# Patient Record
Sex: Male | Born: 1960 | Race: White | Hispanic: No | Marital: Married | Smoking: Never smoker
Health system: Southern US, Community
[De-identification: ages and names within clinical notes are randomized; demographics above are authoritative.]

## PROBLEM LIST (undated history)

## (undated) DIAGNOSIS — I10 Essential (primary) hypertension: Secondary | ICD-10-CM

## (undated) DIAGNOSIS — K219 Gastro-esophageal reflux disease without esophagitis: Secondary | ICD-10-CM

## (undated) DIAGNOSIS — E785 Hyperlipidemia, unspecified: Secondary | ICD-10-CM

## (undated) DIAGNOSIS — K760 Fatty (change of) liver, not elsewhere classified: Secondary | ICD-10-CM

## (undated) DIAGNOSIS — Z8719 Personal history of other diseases of the digestive system: Secondary | ICD-10-CM

## (undated) DIAGNOSIS — Z9889 Other specified postprocedural states: Secondary | ICD-10-CM

## (undated) DIAGNOSIS — C61 Malignant neoplasm of prostate: Secondary | ICD-10-CM

## (undated) DIAGNOSIS — J069 Acute upper respiratory infection, unspecified: Secondary | ICD-10-CM

## (undated) DIAGNOSIS — I739 Peripheral vascular disease, unspecified: Secondary | ICD-10-CM

## (undated) DIAGNOSIS — R112 Nausea with vomiting, unspecified: Secondary | ICD-10-CM

## (undated) DIAGNOSIS — Z86718 Personal history of other venous thrombosis and embolism: Secondary | ICD-10-CM

## (undated) DIAGNOSIS — G5631 Lesion of radial nerve, right upper limb: Secondary | ICD-10-CM

## (undated) DIAGNOSIS — Z86711 Personal history of pulmonary embolism: Secondary | ICD-10-CM

## (undated) DIAGNOSIS — N329 Bladder disorder, unspecified: Secondary | ICD-10-CM

## (undated) HISTORY — PX: COLONOSCOPY: SHX174

## (undated) HISTORY — DX: Hyperlipidemia, unspecified: E78.5

## (undated) HISTORY — PX: TONSILLECTOMY: SUR1361

## (undated) HISTORY — PX: PROSTATE BIOPSY: SHX241

## (undated) HISTORY — DX: Personal history of pulmonary embolism: Z86.711

## (undated) HISTORY — PX: FACIAL NERVE DECOMPRESSION: SHX185

## (undated) HISTORY — DX: Personal history of other venous thrombosis and embolism: Z86.718

## (undated) HISTORY — PX: UPPER GI ENDOSCOPY: SHX6162

## (undated) HISTORY — DX: Malignant neoplasm of prostate: C61

## (undated) HISTORY — PX: OTHER SURGICAL HISTORY: SHX169

---

## 2001-08-23 ENCOUNTER — Inpatient Hospital Stay (HOSPITAL_COMMUNITY): Admission: EM | Admit: 2001-08-23 | Discharge: 2001-08-29 | Payer: Self-pay | Admitting: Emergency Medicine

## 2004-03-29 HISTORY — PX: NM MYOCAR PERF WALL MOTION: HXRAD629

## 2005-03-16 HISTORY — PX: CARDIAC CATHETERIZATION: SHX172

## 2007-03-30 HISTORY — PX: TRANSTHORACIC ECHOCARDIOGRAM: SHX275

## 2009-09-30 ENCOUNTER — Ambulatory Visit (HOSPITAL_COMMUNITY): Admission: RE | Admit: 2009-09-30 | Discharge: 2009-09-30 | Payer: Self-pay | Admitting: Orthopedic Surgery

## 2010-06-14 LAB — BASIC METABOLIC PANEL
BUN: 10 mg/dL (ref 6–23)
CO2: 30 mEq/L (ref 19–32)
Calcium: 9.5 mg/dL (ref 8.4–10.5)
Chloride: 104 mEq/L (ref 96–112)
Creatinine, Ser: 0.87 mg/dL (ref 0.4–1.5)
GFR calc Af Amer: >60 mL/min (ref >60)
GFR calc non Af Amer: >60 mL/min (ref >60)
Glucose, Bld: 93 mg/dL (ref 70–99)
Potassium: 5.4 mEq/L — ABNORMAL HIGH (ref 3.5–5.1)
Sodium: 139 mEq/L (ref 135–145)

## 2010-06-14 LAB — CBC
HCT: 47.3 % (ref 39.0–52.0)
Hemoglobin: 16.3 g/dL (ref 13.0–17.0)
MCH: 34.2 pg — ABNORMAL HIGH (ref 26.0–34.0)
MCHC: 34.4 g/dL (ref 30.0–36.0)
MCV: 99.3 fL (ref 78.0–100.0)
Platelets: 176 10*3/uL (ref 150–400)
RBC: 4.77 MIL/uL (ref 4.22–5.81)
RDW: 12.2 % (ref 11.5–15.5)
WBC: 6.4 10*3/uL (ref 4.0–10.5)

## 2010-06-14 LAB — DIFFERENTIAL
Lymphocytes Relative: 24 % (ref 12–46)
Monocytes Absolute: 0.6 10*3/uL (ref 0.1–1.0)
Monocytes Relative: 9 % (ref 3–12)
Neutro Abs: 3.7 10*3/uL (ref 1.7–7.7)

## 2010-06-14 LAB — SURGICAL PCR SCREEN
MRSA, PCR: NEGATIVE
Staphylococcus aureus: POSITIVE — AB

## 2010-08-14 NOTE — Discharge Summary (Signed)
[place]. [facility]  Patient:    Eric Olsen, Eric Olsen Visit Number: [id number] MRN: [id number]          Service Type: MED Location: [phone] Attending Physician:  Anastasio Auerbach Dictated by:   Anastasio Auerbach, M.D. Admit Date:  08/23/2001 Discharge Date: 08/29/2001   CC:         Lilyan Punt. Sydnee Levans, M.D.  Madaline Savage, M.D.   Discharge Summary  DATE OF BIRTH:  June 06, 1960  DISCHARGE DIAGNOSES: 1. Pulmonary embolus (based on clinical presentation).    a. Acute left lower extremity deep venous thrombosis (ultrasound done at       cardiovascular surgery service office). 2. Chest pain and shortness of breath secondary to above. 3. Gastroesophageal reflux disease. 4. Hypertension. 5. Mild transaminitis.    a. On August 28, 2001, SGOT was 57, STPT 51.    b. Follow up as an outpatient.  DISCHARGE MEDICATIONS: 1. (New) Coumadin 4 mg q.d. 2. Lotensin 20 mg q.d. (as before). 3. Calcium as before. 4. Would not resume garlic pills or other supplements until you are off    Coumadin. 5. No aspirin of ibuprofen like products.  DISCHARGE INSTRUCTIONS:  Recommended activity, no contact sports. Recommended diet, low fat, low salt.  SPECIAL INSTRUCTIONS: 1. Call or return if you have problems. 2. You will get your blood work (protimes) checked at Dr. Jory Ee office.    The patient is to go there at 9:30 on Friday and then the following Tuesday    at 9:30. Dr. Ronald Lobo will be adjusting his dose. The patient is to    schedule follow up with him in two weeks to check comprehensive metabolic    panel as well, sooner if any problems arise.  CONSULTANTS:  None.  PROCEDURES PERFORMED: 1. Electrocardiogram. Normal sinus rhythm at a rate of 80. 2. Spiral chest CT scan with lower extremity cuts (Aug 23, 2001), no evidence    of pulmonary embolus centrally. Lungs are clear. No definite acute lower    extremity or pelvic vein DVT by CT. 3. The ultrasound done  at the CVTS office indicated there was a DVT in the    left calf and that was only partly imaged on this CT examination. They said    there may be minimal thrombus within one of the left calf veins on image    218.  HOSPITAL COURSE:  #1 - PROBABLE PULMONARY EMBOLUS:  The patient is a 50 year old Caucasian gentleman with a history of gastroesophageal reflux disease and hypertension. He had a left Achilles tendon injury approximately two weeks ago. It started to improve and then began to have more swelling and pain. He was ultrasounded as an outpatient and was found to have a DVT.  On 24 hours prior to admission he was also noting some sharp chest pains associated with shortness of breath and dyspnea on exertion. Although a chest CT scan was negative for PE, certainly his symptoms suggest peripheral PE, and therefore I treated him as such. He was treated with IV heparin and overlapped for 48 hours. An INR on the day of discharge was 2.6. He will be followed by Dr. Wilfred Curtis. Recommend three to six months of therapy. Of note, protein C and protein S were normal, factor V Leiden gene mutation and prothrombin gene mutation were negative. His predisposition was likely his injury two weeks prior to presentation.  OTHER PERTINENT DISCHARGE LABS:  Sodium 139, potassium 4.5, chloride 103, bicarbonate  29, BUN 11, creatinine 1.0, glucose 101, calcium 9.4. Total protein 7.1, albumin 3.8. SGOT 57, SGPT 51 (45 and 22 on admission), alkaline phosphatase 53, total bilirubin 0.4.  Hemoglobin 15.6, MCV 93, WBCs 7800, platelet count 203, normal differential. Dictated by:   Anastasio Auerbach, M.D. Attending Physician:  Anastasio Auerbach DD:  09/30/01 TD:  10/03/01 Job: [id number]

## 2011-03-10 ENCOUNTER — Other Ambulatory Visit: Payer: Self-pay | Admitting: Urology

## 2011-04-16 ENCOUNTER — Encounter (HOSPITAL_COMMUNITY): Payer: Self-pay | Admitting: Pharmacy Technician

## 2011-04-26 ENCOUNTER — Ambulatory Visit (HOSPITAL_COMMUNITY)
Admission: RE | Admit: 2011-04-26 | Discharge: 2011-04-26 | Disposition: A | Discharge status: Home / Self Care | Payer: 59 | Source: Ambulatory Visit | Attending: Urology | Admitting: Urology

## 2011-04-26 ENCOUNTER — Other Ambulatory Visit: Payer: Self-pay

## 2011-04-26 ENCOUNTER — Encounter (HOSPITAL_COMMUNITY)
Admission: RE | Admit: 2011-04-26 | Discharge: 2011-04-26 | Disposition: A | Discharge status: Home / Self Care | Payer: 59 | Source: Ambulatory Visit | Attending: Urology | Admitting: Urology

## 2011-04-26 ENCOUNTER — Encounter (HOSPITAL_COMMUNITY): Payer: Self-pay

## 2011-04-26 DIAGNOSIS — I10 Essential (primary) hypertension: Secondary | ICD-10-CM | POA: Diagnosis present

## 2011-04-26 DIAGNOSIS — Z01812 Encounter for preprocedural laboratory examination: Secondary | ICD-10-CM | POA: Insufficient documentation

## 2011-04-26 DIAGNOSIS — C61 Malignant neoplasm of prostate: Principal | ICD-10-CM | POA: Diagnosis present

## 2011-04-26 DIAGNOSIS — J45909 Unspecified asthma, uncomplicated: Secondary | ICD-10-CM | POA: Diagnosis present

## 2011-04-26 DIAGNOSIS — Z01810 Encounter for preprocedural cardiovascular examination: Secondary | ICD-10-CM | POA: Insufficient documentation

## 2011-04-26 DIAGNOSIS — Z01818 Encounter for other preprocedural examination: Secondary | ICD-10-CM | POA: Insufficient documentation

## 2011-04-26 DIAGNOSIS — Z86718 Personal history of other venous thrombosis and embolism: Secondary | ICD-10-CM

## 2011-04-26 DIAGNOSIS — I739 Peripheral vascular disease, unspecified: Secondary | ICD-10-CM | POA: Diagnosis present

## 2011-04-26 HISTORY — DX: Other specified postprocedural states: R11.2

## 2011-04-26 HISTORY — DX: Peripheral vascular disease, unspecified: I73.9

## 2011-04-26 HISTORY — DX: Acute upper respiratory infection, unspecified: J06.9

## 2011-04-26 HISTORY — DX: Essential (primary) hypertension: I10

## 2011-04-26 HISTORY — DX: Other specified postprocedural states: Z98.890

## 2011-04-26 LAB — BASIC METABOLIC PANEL
BUN: 13 mg/dL (ref 6–23)
Calcium: 9.8 mg/dL (ref 8.4–10.5)
GFR calc non Af Amer: >90 mL/min (ref >90)
Glucose, Bld: 92 mg/dL (ref 70–99)

## 2011-04-26 LAB — CBC
HCT: 42.7 % (ref 39.0–52.0)
Hemoglobin: 15.2 g/dL (ref 13.0–17.0)
MCH: 33 pg (ref 26.0–34.0)
MCHC: 35.6 g/dL (ref 30.0–36.0)
MCV: 92.6 fL (ref 78.0–100.0)

## 2011-04-26 NOTE — Patient Instructions (Signed)
21 Eric Olsen  04/26/2011   Your procedure is scheduled on: 04/28/11  Wednesday  Surgery [id number]  Report to [facility] at 0600 AM.  Call this number if you have problems the morning of surgery: [phone]              Bowel prep as directed by office- increase fluids   TUesday  Until midnight Tuesday night  Remember:   Do not eat food:After Midnight.  Monday night  May have clear liquids:until Midnight .   Tuesday night  Clear liquids include soda, tea, black coffee, apple or grape juice, broth.  Take these medicines the morning of surgery with A SIP OF WATER: none      ALBUTEROL INHALER IF NEDDED           Bring inhaler with you to hospital  Do not wear jewelry, make-up or nail polish.  Do not wear lotions, powders, or perfumes. You may wear deodorant.  Do not shave 48 hours prior to surgery.  Do not bring valuables to the hospital.  Contacts, dentures or bridgework may not be worn into surgery.  Leave suitcase in the car. After surgery it may be brought to your room.  For patients admitted to the hospital, checkout time is 11:00 AM the day of discharge.   Patients discharged the day of surgery will not be allowed to drive home.  Name and phone number of your driver    wife Special Instructions: CHG Shower Use Special Wash: 1/2 bottle night before surgery and 1/2 bottle morning of surgery. Regular soap face and privates   Please read over the following fact sheets that you were given: MRSA Information

## 2011-04-27 NOTE — Pre-Procedure Instructions (Signed)
1255- preliminary result only re nasal swab culture- chart to Short Stay and flagged as the results are not confirmed- sent out to lab

## 2011-04-27 NOTE — H&P (Signed)
Urology Admission H&P  Chief Complaint: Prostate cancer  History of Present Illness: History of Present Illness   Eric Olsen presents today along with Eric Olsen wife and is sent to me via the courtesy of Dr. Su Grand for consideration of robotic prostatectomy. Eric Olsen is 51 years of age. Eric Olsen was diagnosed with favorable clinical stage T1c prostate cancer approximately a month ago. PSA was minimally elevated and did not appear to be going up really rapidly prior to Eric Olsen diagnosis. The patient'Olsen PSA was close to 4 in April of 2010 and then had increased to 4.4. Repeat PSA in our office was 4.8. Ultrasound revealed a 32 gram prostate. Digital rectal exam revealed nothing of concern. The patient'Olsen biopsies were positive bilaterally. Eric Olsen did have 5/6 biopsies positive on the right and 3/6 on the left. Fortunately all of the biopsies revealed a Gleason 3+3=6 cancer. Core involvement ranged anywhere from 5-40% per core. No additional imaging studies were indicated. The patient has had several discussions with Dr. Brunilda Payor. Eric Olsen has reviewed information and has also discussed some issues with one of my patients. Eric Olsen is interested in proceeding with a surgical approach and is here, again, specifically to talk about the issues with regard to robotic prostatectomy. Eric Olsen really has no significant voiding complaints. No intraabdominal surgery. Preoperative sexual functioning score is 25/25.    Past Medical History  Diagnosis Date  . PONV (postoperative nausea and vomiting)   . Peripheral vascular disease     hx DVT 10 years ago following achilles tendon tear-  last anticoagulation years ago  . Cancer     prostate  . Recurrent upper respiratory infection (URI)     recent head cold- was on augumentin- states no fever- states resolved  . Asthma     states well controlled  . Hypertension     cardio Dr Rennis Golden-  LOV note 2/12 on chart   Past Surgical History  Procedure Date  . Tonsillectomy   . Cardiac catheterization       years ago  Dr Hilty/ eccho years ago  . Facial nerve decompression     right elbow- radial     Home Medications:  No prescriptions prior to admission   Allergies: No Known Allergies  No family history on file. Social History:  reports that Eric Olsen has never smoked. Eric Olsen has never used smokeless tobacco. Eric Olsen reports that Eric Olsen does not drink alcohol or use illicit drugs.  Review of Systems  Constitutional: Negative.   Eyes: Negative for blurred vision.  Respiratory: Negative.   Cardiovascular: Negative for chest pain.  Gastrointestinal: Negative.   Genitourinary: Negative.   Skin: Negative.   Neurological: Negative for dizziness and headaches.  Endo/Heme/Allergies: Negative.   Psychiatric/Behavioral: Negative for depression.    Physical Exam:  Vital signs in last 24 hours:   Physical Exam  Constitutional: Eric Olsen is oriented to person, place, and time. Eric Olsen appears well-developed and well-nourished.  HENT:  Head: Normocephalic.  Cardiovascular: Normal rate and regular rhythm.   Respiratory: Effort normal.  GI: Soft. Eric Olsen exhibits no distension. There is no tenderness.  Genitourinary: Prostate normal, testes normal and penis normal.  Neurological: Eric Olsen is alert and oriented to person, place, and time.  Skin: Skin is warm.  Psychiatric: Eric Olsen has a normal mood and affect.    Laboratory Data:  No results found for this or any previous visit (from the past 24 hour(Olsen)). Recent Results (from the past 240 hour(Olsen))  SURGICAL PCR SCREEN     Status: Abnormal  Collection Time   04/26/11  9:21 AM      Component Value Range Status Comment   MRSA, PCR INVALID RESULTS, SPECIMEN SENT FOR CULTURE (*) NEGATIVE  Final SUITS,L. RN AT 1145 ON 04/26/11 BY GILLESPIE,B.   Staphylococcus aureus INVALID RESULTS, SPECIMEN SENT FOR CULTURE (*) NEGATIVE  Final   MRSA CULTURE     Status: Normal (Preliminary result)   Collection Time   04/26/11  9:21 AM      Component Value Range Status Comment   Specimen  Description NOSE   Final    Special Requests NONE   Final    Culture NO SUSPICIOUS COLONIES, CONTINUING TO HOLD   Final    Report Status PENDING   Incomplete    Creatinine:  Basename 04/26/11 0930  CREATININE 0.84   Baseline Creatinine:    Impression/Assessment:  Discussion/Summary  The patient was counseled about the natural history of prostate cancer and the standard treatment options that are available for prostate cancer. It was explained to him how Eric Olsen age and life expectancy, clinical stage, Gleason score, and PSA affect Eric Olsen prognosis, the decision to proceed with additional staging studies, as well as how that information influences recommended treatment strategies. We discussed the roles for active surveillance, radiation therapy, surgical therapy, androgen deprivation, as well as ablative therapy options for the treatment of prostate cancer as appropriate to Eric Olsen individual cancer situation. We discussed the risks and benefits of these options with regard to their impact on cancer control and also in terms of potential adverse events, complications, and impact on quiality of life particularly related to urinary, bowel, and sexual function. The patient was encouraged to ask questions throughout the discussion today and all questions were answered to Eric Olsen stated satisfaction. In addition, the patient was provided with and/or directed to appropriate resources and literature for further education about prostate cancer and treatment options.   We discussed surgical therapy for prostate cancer including the different available surgical approaches. We discussed, in detail, the risks and expectations of surgery with regard to cancer control, urinary control, and erectile function as well as the expected postoperative recovery process. The risks, potential complications/adverse events of radical prostatectomy as well as alternative options were explained to the patient.   We discussed surgical therapy  for prostate cancer including the different available surgical approaches. We discussed, in detail, the risks and expectations of surgery with regard to cancer control, urinary control, and erectile function as well as the expected postoperative recovery process. Additional risks of surgery including but not limited to bleeding, infection, hernia formation, nerve damage, lymphocele formation, bowel/rectal injury potentially necessitating colostomy, damage to the urinary tract resulting in urine leakage, urethral stricture, and the cardiopulmonary risks such as myocardial infarction, stroke, death, venothromboembolism, etc. were explained. The risk of open surgical conversion for robotic/laparoscopic prostatectomy was also discussed.     Plan: For surgery this AM  Eric Olsen 04/27/2011, 9:15 PM

## 2011-04-28 ENCOUNTER — Other Ambulatory Visit: Payer: Self-pay | Admitting: Urology

## 2011-04-28 ENCOUNTER — Inpatient Hospital Stay (HOSPITAL_COMMUNITY): Payer: 59 | Admitting: Anesthesiology

## 2011-04-28 ENCOUNTER — Inpatient Hospital Stay (HOSPITAL_COMMUNITY)
Admission: RE | Admit: 2011-04-28 | Discharge: 2011-04-29 | DRG: 708 | Disposition: A | Discharge status: Home / Self Care | Payer: 59 | Source: Ambulatory Visit | Attending: Urology | Admitting: Urology

## 2011-04-28 ENCOUNTER — Encounter (HOSPITAL_COMMUNITY): Payer: Self-pay | Admitting: Anesthesiology

## 2011-04-28 ENCOUNTER — Encounter (HOSPITAL_COMMUNITY)
Admission: RE | Disposition: A | Discharge status: Home / Self Care | Payer: Self-pay | Source: Ambulatory Visit | Attending: Urology

## 2011-04-28 ENCOUNTER — Encounter (HOSPITAL_COMMUNITY): Payer: Self-pay | Admitting: *Deleted

## 2011-04-28 HISTORY — PX: ROBOT ASSISTED LAPAROSCOPIC RADICAL PROSTATECTOMY: SHX5141

## 2011-04-28 LAB — TYPE AND SCREEN: ABO/RH(D): A POS

## 2011-04-28 LAB — BASIC METABOLIC PANEL
Chloride: 102 mEq/L (ref 96–112)
GFR calc Af Amer: >90 mL/min (ref >90)
Potassium: 4 mEq/L (ref 3.5–5.1)

## 2011-04-28 LAB — MRSA CULTURE

## 2011-04-28 LAB — HEMOGLOBIN AND HEMATOCRIT, BLOOD: Hemoglobin: 14.2 g/dL (ref 13.0–17.0)

## 2011-04-28 SURGERY — ROBOTIC ASSISTED LAPAROSCOPIC RADICAL PROSTATECTOMY
Anesthesia: General | Site: Prostate | Wound class: Clean Contaminated

## 2011-04-28 MED ORDER — SODIUM CHLORIDE 0.9 % IV SOLN
1.5000 g | INTRAVENOUS | Status: AC
  Administered 2011-04-28: 1.5 g via INTRAVENOUS

## 2011-04-28 MED ORDER — HYDROCODONE-ACETAMINOPHEN 5-325 MG PO TABS: 1.0000 | ORAL_TABLET | Freq: Four times a day (QID) | ORAL | Status: AC | PRN

## 2011-04-28 MED ORDER — ROCURONIUM BROMIDE 100 MG/10ML IV SOLN
INTRAVENOUS | Status: DC | PRN
  Administered 2011-04-28: 10 mg via INTRAVENOUS
  Administered 2011-04-28: 50 mg via INTRAVENOUS
  Administered 2011-04-28: 5 mg via INTRAVENOUS
  Administered 2011-04-28: 20 mg via INTRAVENOUS
  Administered 2011-04-28: 5 mg via INTRAVENOUS
  Administered 2011-04-28: 10 mg via INTRAVENOUS

## 2011-04-28 MED ORDER — HYDROMORPHONE HCL PF 1 MG/ML IJ SOLN
0.2500 mg | INTRAMUSCULAR | Status: DC | PRN
  Administered 2011-04-28: 0.25 mg via INTRAVENOUS
  Administered 2011-04-28: 0.5 mg via INTRAVENOUS
  Administered 2011-04-28 (×4): 0.25 mg via INTRAVENOUS

## 2011-04-28 MED ORDER — MORPHINE SULFATE 2 MG/ML IJ SOLN
2.0000 mg | INTRAMUSCULAR | Status: DC | PRN
  Administered 2011-04-29: 2 mg via INTRAVENOUS
  Filled 2011-04-28: qty 1

## 2011-04-28 MED ORDER — ONDANSETRON HCL 4 MG/2ML IJ SOLN
INTRAMUSCULAR | Status: DC | PRN
  Administered 2011-04-28: 4 mg via INTRAVENOUS

## 2011-04-28 MED ORDER — LACTATED RINGERS IR SOLN
Status: DC | PRN
  Administered 2011-04-28: 1000 mL

## 2011-04-28 MED ORDER — ALBUTEROL SULFATE HFA 108 (90 BASE) MCG/ACT IN AERS
2.0000 | INHALATION_SPRAY | Freq: Four times a day (QID) | RESPIRATORY_TRACT | Status: DC | PRN
  Filled 2011-04-28: qty 6.7

## 2011-04-28 MED ORDER — CEFAZOLIN SODIUM 1-5 GM-% IV SOLN
1.0000 g | Freq: Three times a day (TID) | INTRAVENOUS | Status: AC
  Administered 2011-04-28 (×2): 1 g via INTRAVENOUS
  Filled 2011-04-28 (×6): qty 50

## 2011-04-28 MED ORDER — LACTATED RINGERS IV SOLN
INTRAVENOUS | Status: DC | PRN
  Administered 2011-04-28 (×2): via INTRAVENOUS

## 2011-04-28 MED ORDER — PROPOFOL 10 MG/ML IV BOLUS
INTRAVENOUS | Status: DC | PRN
  Administered 2011-04-28: 150 mg via INTRAVENOUS

## 2011-04-28 MED ORDER — DEXAMETHASONE SODIUM PHOSPHATE 10 MG/ML IJ SOLN
INTRAMUSCULAR | Status: DC | PRN
  Administered 2011-04-28: 8 mg via INTRAVENOUS

## 2011-04-28 MED ORDER — LISINOPRIL 20 MG PO TABS
20.0000 mg | ORAL_TABLET | Freq: Every day | ORAL | Status: DC
  Administered 2011-04-29: 20 mg via ORAL
  Filled 2011-04-28 (×2): qty 1

## 2011-04-28 MED ORDER — HYDROCODONE-ACETAMINOPHEN 5-325 MG PO TABS
1.0000 | ORAL_TABLET | ORAL | Status: DC | PRN
  Administered 2011-04-29: 1 via ORAL
  Filled 2011-04-28: qty 1

## 2011-04-28 MED ORDER — ACETAMINOPHEN 10 MG/ML IV SOLN
INTRAVENOUS | Status: DC | PRN
  Administered 2011-04-28: 1000 mg via INTRAVENOUS

## 2011-04-28 MED ORDER — HEPARIN SODIUM (PORCINE) 1000 UNIT/ML IJ SOLN
INTRAMUSCULAR | Status: DC | PRN
  Administered 2011-04-28: 1000 [IU]

## 2011-04-28 MED ORDER — KCL IN DEXTROSE-NACL 10-5-0.45 MEQ/L-%-% IV SOLN
INTRAVENOUS | Status: DC
  Administered 2011-04-28: 22:00:00 via INTRAVENOUS
  Administered 2011-04-28: 1000 mL via INTRAVENOUS
  Administered 2011-04-29: 06:00:00 via INTRAVENOUS
  Filled 2011-04-28 (×6): qty 1000

## 2011-04-28 MED ORDER — ACETAMINOPHEN 10 MG/ML IV SOLN
1000.0000 mg | Freq: Four times a day (QID) | INTRAVENOUS | Status: AC
  Administered 2011-04-28 – 2011-04-29 (×4): 1000 mg via INTRAVENOUS
  Filled 2011-04-28 (×4): qty 100

## 2011-04-28 MED ORDER — SODIUM CHLORIDE 0.9 % IR SOLN
Status: DC | PRN
  Administered 2011-04-28: 1

## 2011-04-28 MED ORDER — GLYCOPYRROLATE 0.2 MG/ML IJ SOLN
INTRAMUSCULAR | Status: DC | PRN
  Administered 2011-04-28: .5 mg via INTRAVENOUS

## 2011-04-28 MED ORDER — FENTANYL CITRATE 0.05 MG/ML IJ SOLN
INTRAMUSCULAR | Status: DC | PRN
  Administered 2011-04-28: 100 ug via INTRAVENOUS
  Administered 2011-04-28 (×8): 50 ug via INTRAVENOUS

## 2011-04-28 MED ORDER — NEOSTIGMINE METHYLSULFATE 1 MG/ML IJ SOLN
INTRAMUSCULAR | Status: DC | PRN
  Administered 2011-04-28: 4 mg via INTRAVENOUS

## 2011-04-28 MED ORDER — CIPROFLOXACIN HCL 500 MG PO TABS: 500.0000 mg | ORAL_TABLET | Freq: Two times a day (BID) | ORAL | Status: AC

## 2011-04-28 MED ORDER — PROMETHAZINE HCL 25 MG/ML IJ SOLN: 6.2500 mg | INTRAMUSCULAR | Status: DC | PRN

## 2011-04-28 MED ORDER — CALCIUM CARBONATE ANTACID 500 MG PO CHEW
1.0000 | CHEWABLE_TABLET | Freq: Three times a day (TID) | ORAL | Status: DC
  Administered 2011-04-28 – 2011-04-29 (×2): 200 mg via ORAL
  Filled 2011-04-28 (×8): qty 1

## 2011-04-28 MED ORDER — MIDAZOLAM HCL 5 MG/5ML IJ SOLN
INTRAMUSCULAR | Status: DC | PRN
  Administered 2011-04-28: 2 mg via INTRAVENOUS

## 2011-04-28 MED ORDER — LACTATED RINGERS IV SOLN: INTRAVENOUS | Status: DC

## 2011-04-28 MED ORDER — SODIUM CHLORIDE 0.9 % IV BOLUS (SEPSIS)
1000.0000 mL | Freq: Once | INTRAVENOUS | Status: AC
  Administered 2011-04-28: 1000 mL via INTRAVENOUS

## 2011-04-28 MED ORDER — INDIGOTINDISULFONATE SODIUM 8 MG/ML IJ SOLN
INTRAMUSCULAR | Status: DC | PRN
  Administered 2011-04-28 (×2): 8 mg via INTRAVENOUS

## 2011-04-28 MED ORDER — BUPIVACAINE-EPINEPHRINE 0.25% -1:200000 IJ SOLN
INTRAMUSCULAR | Status: DC | PRN
  Administered 2011-04-28: 26 mL

## 2011-04-28 SURGICAL SUPPLY — 46 items
CANISTER SUCTION 2500CC (MISCELLANEOUS) ×2 IMPLANT
CATH FOLEY 2WAY SLVR  5CC 20FR (CATHETERS) ×1
CATH FOLEY 2WAY SLVR 5CC 20FR (CATHETERS) ×1 IMPLANT
CATH ROBINSON RED A/P 8FR (CATHETERS) ×2 IMPLANT
CHLORAPREP W/TINT 26ML (MISCELLANEOUS) ×2 IMPLANT
CLIP LIGATING HEM O LOK PURPLE (MISCELLANEOUS) IMPLANT
CLOTH BEACON ORANGE TIMEOUT ST (SAFETY) ×2 IMPLANT
CORD HIGH FREQUENCY UNIPOLAR (ELECTROSURGICAL) ×4 IMPLANT
COVER SURGICAL LIGHT HANDLE (MISCELLANEOUS) ×2 IMPLANT
COVER TIP SHEARS 8 DVNC (MISCELLANEOUS) ×1 IMPLANT
COVER TIP SHEARS 8MM DA VINCI (MISCELLANEOUS) ×1
CUTTER ECHEON FLEX ENDO 45 340 (ENDOMECHANICALS) ×2 IMPLANT
DECANTER SPIKE VIAL GLASS SM (MISCELLANEOUS) IMPLANT
DRAPE SURG IRRIG POUCH 19X23 (DRAPES) ×2 IMPLANT
DRAPE UTILITY 15X26 (DRAPE) ×2 IMPLANT
DRSG TEGADERM 6X8 (GAUZE/BANDAGES/DRESSINGS) ×4 IMPLANT
ELECT REM PT RETURN 9FT ADLT (ELECTROSURGICAL) ×2
ELECTRODE REM PT RTRN 9FT ADLT (ELECTROSURGICAL) ×1 IMPLANT
GLOVE BIO SURGEON STRL SZ 6.5 (GLOVE) ×4 IMPLANT
GLOVE BIOGEL M STRL SZ7.5 (GLOVE) ×12 IMPLANT
GOWN PREVENTION PLUS XLARGE (GOWN DISPOSABLE) ×2 IMPLANT
GOWN STRL NON-REIN LRG LVL3 (GOWN DISPOSABLE) ×2 IMPLANT
GOWN STRL REIN XL XLG (GOWN DISPOSABLE) ×2 IMPLANT
HOLDER FOLEY CATH W/STRAP (MISCELLANEOUS) ×2 IMPLANT
IV LACTATED RINGERS 1000ML (IV SOLUTION) ×2 IMPLANT
KIT ACCESSORY DA VINCI DISP (KITS) ×1
KIT ACCESSORY DVNC DISP (KITS) ×1 IMPLANT
NDL SAFETY ECLIPSE 18X1.5 (NEEDLE) ×1 IMPLANT
NEEDLE HYPO 18GX1.5 SHARP (NEEDLE) ×1
PACK ROBOT UROLOGY CUSTOM (CUSTOM PROCEDURE TRAY) ×2 IMPLANT
POSITIONER SURGICAL ARM (MISCELLANEOUS) IMPLANT
RELOAD GREEN ECHELON 45 (STAPLE) ×2 IMPLANT
SCISSORS LAP 5X45 EPIX DISP (ENDOMECHANICALS) ×2 IMPLANT
SEALER TISSUE G2 CVD JAW 45CM (ENDOMECHANICALS) ×2 IMPLANT
SET TUBE IRRIG SUCTION NO TIP (IRRIGATION / IRRIGATOR) ×2 IMPLANT
SHEET LAVH (DRAPES) ×2 IMPLANT
SOLUTION ELECTROLUBE (MISCELLANEOUS) ×2 IMPLANT
SPONGE GAUZE 4X4 12PLY (GAUZE/BANDAGES/DRESSINGS) ×2 IMPLANT
SUT MNCRL 3 0 RB1 (SUTURE) ×1 IMPLANT
SUT MONOCRYL 3 0 RB1 (SUTURE) ×1
SUT VIC AB 2-0 SH 27 (SUTURE) ×1
SUT VIC AB 2-0 SH 27X BRD (SUTURE) ×1 IMPLANT
SUT VICRYL 0 UR6 27IN ABS (SUTURE) ×2 IMPLANT
SYR 27GX1/2 1ML LL SAFETY (SYRINGE) ×2 IMPLANT
TOWEL OR NON WOVEN STRL DISP B (DISPOSABLE) ×2 IMPLANT
WATER STERILE IRR 1500ML POUR (IV SOLUTION) ×4 IMPLANT

## 2011-04-28 NOTE — Interval H&P Note (Signed)
History and Physical Interval Note:  04/28/2011 8:22 AM  Eric Olsen  has presented today for surgery, with the diagnosis of PROSTATE CANCER  The various methods of treatment have been discussed with the patient and family. After consideration of risks, benefits and other options for treatment, the patient has consented to  Procedure(s): ROBOTIC ASSISTED LAPAROSCOPIC RADICAL PROSTATECTOMY as a surgical intervention .  The patients' history has been reviewed, patient examined, no change in status, stable for surgery.  I have reviewed the patients' chart and labs.  Questions were answered to the patient's satisfaction.     Weylyn Ricciuti S

## 2011-04-28 NOTE — Preoperative (Signed)
Beta Blockers   Reason not to administer Beta Blockers:Not Applicable 

## 2011-04-28 NOTE — Transfer of Care (Signed)
Immediate Anesthesia Transfer of Care Note  Patient: Eric Olsen  Procedure(s) Performed:  ROBOTIC ASSISTED LAPAROSCOPIC RADICAL PROSTATECTOMY  Patient Location: PACU  Anesthesia Type: General  Level of Consciousness: awake and patient cooperative  Airway & Oxygen Therapy: Patient Spontanous Breathing and Patient connected to face mask oxygen  Post-op Assessment: Report given to PACU RN and Post -op Vital signs reviewed and stable  Post vital signs: Reviewed and stable  Complications: No apparent anesthesia complications

## 2011-04-28 NOTE — Anesthesia Preprocedure Evaluation (Addendum)
Anesthesia Evaluation  Patient identified by MRN, date of birth, ID band Patient awake    Reviewed: Allergy & Precautions, H&P , NPO status , Patient's Chart, lab work & pertinent test results  History of Anesthesia Complications (+) PONV  Airway Mallampati: II TM Distance: >3 FB Neck ROM: full    Dental No notable dental hx. (+) Dental Advisory Given,    Pulmonary neg pulmonary ROS, asthma , Recent URI ,  asthma mild to mod. clear to auscultation  Pulmonary exam normal       Cardiovascular Exercise Tolerance: Good hypertension, Pt. on medications regular Normal    Neuro/Psych Negative Neurological ROS  Negative Psych ROS   GI/Hepatic negative GI ROS, Neg liver ROS,   Endo/Other  Negative Endocrine ROS  Renal/GU negative Renal ROS  Genitourinary negative   Musculoskeletal   Abdominal   Peds  Hematology negative hematology ROS (+)   Anesthesia Other Findings   Reproductive/Obstetrics negative OB ROS                          Anesthesia Physical Anesthesia Plan  ASA: II  Anesthesia Plan: General   Post-op Pain Management:    Induction: Intravenous  Airway Management Planned: Oral ETT  Additional Equipment:   Intra-op Plan:   Post-operative Plan: Extubation in OR  Informed Consent: I have reviewed the patients History and Physical, chart, labs and discussed the procedure including the risks, benefits and alternatives for the proposed anesthesia with the patient or authorized representative who has indicated his/her understanding and acceptance.   Dental Advisory Given  Plan Discussed with: CRNA and Surgeon  Anesthesia Plan Comments:        Anesthesia Quick Evaluation

## 2011-04-28 NOTE — Op Note (Signed)
Preoperative diagnosis: Clinical stage T1c Adenocarcinoma prostate  Postoperative diagnosis: Same  Procedure: Robotic-assisted laparoscopic radical retropubic prostatectomy   Surgeon: Valetta Fuller, MD  Asst.: Pecola Leisure, PA Anesthesia: Gen. Endotracheal  Indications: Patient was diagnosed with clinical stage TIc Adenocarcinoma the prostate. He underwent extensive consultation with regard to treatment options. The patient decided on a surgical approach. He appeared to understand the distinct advantages as well as the disadvantages of this procedure. The patient has performed a mechanical bowel prep. He has had placement of PAS compression boots and has received perioperative antibiotics. The patient's preoperative PSA was 4.8. Ultrasound revealed a 32 g prostate. ( see H and P for cancer details) Technique and findings:The patient was brought to the operating room and had successful induction of general endotracheal anesthesia.the patient was placed in a low lithotomy position with careful padding of all extremities. He was secured to the operative table and placed in the steep Trendelenburg position. He was prepped and draped in usual manner. A Foley catheter was placed sterilely on the field. Camera port site was chosen 18 cm above the pubic symphysis just to the left of the umbilicus. A standard open Hassan technique was utilized. A 12 mm trocar was placed without difficulty. The camera was then inserted and no abnormalities were noted within the pelvis. The trochars were placed with direct visual guidance. This included 3 8mm robotic trochars and a 12 mm and 5 mm assist ports. Once all the ports were placed the robot was docked. The bladder was filled and the space of Retzius was developed with electrocautery dissection as well as blunt dissection. Superficial fat off the endopelvic fascia and bladder neck was removed with electrocautery scissors. The endopelvic fascia was then incised bilaterally  from base to apex. Levator musculature was swept off the apex of the prostate isolating the dorsal venous complex which was then stapled with the ETS stapling device. The anterior bladder neck was identified with the aid of the Foley balloon. This was then transected down to the Foley catheter with electrocautery scissors. The Foley catheter was then retracted anteriorly. Indigo carmine was given and we appeared to be well away from the ureteral orifices. The posterior bladder neck was then transected and the dissection carried down to the adnexal structures. The seminal vesicles and vas deferens on both sides were then individually dissected free and retracted anteriorly. The posterior plane between the rectum and prostate was then established primarily with blunt dissection.  Attention was then turned towards nerve sparing. The patient was felt to be a candidate for bilateral nerve sparing. Superficial fascia along the anterior lateral aspect of the prostate was incised bilaterally. This tissue was then swept laterally until we were able to establish a groove between the neurovascular tissue and the posterior lateral aspect on the prostate bilaterally. This groove was then extended from the apex back to the base of the prostate. With the prostate retracted anteriorly the vascular pedicles of the prostate were taken with the Enseal device. The Foley catheter was then reinserted and the anterior urethra was transected. The posterior urethra was then transected as were some rectourethralis fibers. The prostate was then removed from the pelvis. The pelvis was then copiously irrigated. Rectal insufflation was performed and there was no evidence of rectal injury.   Attention was then turned towards reconstruction. The bladder neck did not require any reconstruction. The bladder neck and posterior urethra were reapproximated at the 6:00 position utilizing a 2-0 Vicryl suture. The rest of  the anastomosis was done with  a double-armed 3-0 Monocryl suture in a 360 degree manner. Additional indigo carmine was given. A new catheter was placed and bladder irrigation revealed no evidence of leakage. A Blake drain was placed through one of the robotic trochars and positioned in the retropubic space above the anastomosis. This was then secured to the skin with a nylon suture. The prostate was placed in the Endopouch retrieval bag. The 12 mm trocar site was closed with a Vicryl suture with the aid of a suture passer. Our other trochars were taken out with direct visual guidance without evidence of any bleeding. The camera port incision was extended slightly to allow for removal of the specimen and then closed with a running Vicryl suture. All port sites were infiltrated with Marcaine and then closed with surgical clips. The patient was then taken to recovery room having had no obvious complications or problems. Sponge and needle counts were correct.

## 2011-04-28 NOTE — Progress Notes (Signed)
Day of Surgery Subjective: Patient reports pain control good. Very sleepy  Objective: Vital signs in last 24 hours: Temp:  [96.9 F (36.1 C)-98.1 F (36.7 C)] 97.5 F (36.4 C) (01/30 1350) Pulse Rate:  [90-109] 109  (01/30 1350) Resp:  [10-20] 12  (01/30 1350) BP: (124-143)/(74-99) 130/86 mmHg (01/30 1350) SpO2:  [98 %-100 %] 98 % (01/30 1350) Weight:  [97.977 kg (216 lb)] 97.977 kg (216 lb) (01/30 1350)  Intake/Output from previous day:   Intake/Output this shift: Total I/O In: 2700 [I.V.:1700; IV Piggyback:1000] Out: 280 [Urine:120; Drains:60; Blood:100]  Physical Exam:  General:cooperative and no distress Cardiovascular: RRR  GI: soft Incisions: C/D/I Urine: dark red Extremities: SCDs in place  Lab Results:  Basename 04/28/11 1231 04/26/11 0930  HGB 14.2 15.2  HCT 40.4 42.7   BMET  Basename 04/28/11 1231 04/26/11 0930  NA 138 136  K 4.0 4.2  CL 102 102  CO2 27 28  GLUCOSE 133* 92  BUN 12 13  CREATININE 0.96 0.84  CALCIUM 8.8 9.8   No results found for this basename: LABPT:3,INR:3 in the last 72 hours No results found for this basename: LABURIN:1 in the last 72 hours Results for orders placed during the hospital encounter of 04/26/11  MRSA CULTURE     Status: Normal   Collection Time   04/26/11  9:21 AM      Component Value Range Status Comment   Specimen Description NOSE   Final    Special Requests NONE   Final    Culture     Final    Value: NO STAPHYLOCOCCUS AUREUS ISOLATED     Note: NOMRSA   Report Status 04/28/2011 FINAL   Final     Assessment/Plan: Day of Surgery, Procedure(s) (LRB): ROBOTIC ASSISTED LAPAROSCOPIC RADICAL PROSTATECTOMY (N/A)  Doing well Ambulate, Incentive spirometry DVT prophylaxis Clear liquids   LOS: 0 days   Silas Flood. 04/28/2011, 3:21 PM

## 2011-04-28 NOTE — Anesthesia Postprocedure Evaluation (Signed)
  Anesthesia Post-op Note  Patient: Eric Olsen  Procedure(s) Performed:  ROBOTIC ASSISTED LAPAROSCOPIC RADICAL PROSTATECTOMY  Patient Location: PACU  Anesthesia Type: General  Level of Consciousness: awake and alert   Airway and Oxygen Therapy: Patient Spontanous Breathing  Post-op Pain: mild  Post-op Assessment: Post-op Vital signs reviewed, Patient's Cardiovascular Status Stable, Respiratory Function Stable, Patent Airway and No signs of Nausea or vomiting  Post-op Vital Signs: stable  Complications: No apparent anesthesia complications

## 2011-04-29 ENCOUNTER — Encounter (HOSPITAL_COMMUNITY): Payer: Self-pay | Admitting: Urology

## 2011-04-29 LAB — HEMOGLOBIN AND HEMATOCRIT, BLOOD: HCT: 38.2 % — ABNORMAL LOW (ref 39.0–52.0)

## 2011-04-29 LAB — BASIC METABOLIC PANEL
GFR calc Af Amer: >90 mL/min (ref >90)
GFR calc non Af Amer: >90 mL/min (ref >90)
Potassium: 4.6 mEq/L (ref 3.5–5.1)
Sodium: 136 mEq/L (ref 135–145)

## 2011-04-29 MED ORDER — BISACODYL 10 MG RE SUPP
10.0000 mg | Freq: Once | RECTAL | Status: AC
  Administered 2011-04-29: 10 mg via RECTAL
  Filled 2011-04-29: qty 1

## 2011-04-29 MED FILL — Bacitracin Zinc Oint 500 Unit/GM: CUTANEOUS | Qty: 15 | Status: AC

## 2011-04-29 NOTE — Progress Notes (Signed)
1 Day Post-Op Subjective: Patient reports pain control good.  Tolerating liquids.  No  Flatus or BM. Tolerating amb.  Sore  Objective: Vital signs in last 24 hours: Temp:  [96.9 F (36.1 C)-98.9 F (37.2 C)] 98.1 F (36.7 C) (01/31 0600) Pulse Rate:  [90-110] 93  (01/31 0600) Resp:  [10-19] 16  (01/31 0600) BP: (108-143)/(67-90) 108/67 mmHg (01/31 0600) SpO2:  [96 %-100 %] 96 % (01/31 0600) Weight:  [97.977 kg (216 lb)] 97.977 kg (216 lb) (01/30 1350)  Intake/Output from previous day: 01/30 0701 - 01/31 0700 In: 4742 [P.O.:612; I.V.:2800; IV Piggyback:1300] Out: 4295 [Urine:4090; Drains:105; Blood:100] Intake/Output this shift: Total I/O In: -  Out: 15 [Drains:15]  Physical Exam:  General:alert, cooperative and no distress Cardiovascular: RRR Lungs: CTA GI: soft, mildly tender, normal bowel sounds Incisions: dressings C/D/I Urine: clear Extremities: warm with SCDs  Lab Results:  Basename 04/29/11 0445 04/28/11 1231 04/26/11 0930  HGB 13.3 14.2 15.2  HCT 38.2* 40.4 42.7   BMET  Basename 04/29/11 0445 04/28/11 1231  NA 136 138  K 4.6 4.0  CL 102 102  CO2 25 27  GLUCOSE 145* 133*  BUN 10 12  CREATININE 0.84 0.96  CALCIUM 8.8 8.8   No results found for this basename: LABPT:3,INR:3 in the last 72 hours No results found for this basename: LABURIN:1 in the last 72 hours Results for orders placed during the hospital encounter of 04/26/11  MRSA CULTURE     Status: Normal   Collection Time   04/26/11  9:21 AM      Component Value Range Status Comment   Specimen Description NOSE   Final    Special Requests NONE   Final    Culture     Final    Value: NO STAPHYLOCOCCUS AUREUS ISOLATED     Note: NOMRSA   Report Status 04/28/2011 FINAL   Final     Assessment/Plan: 1 Day Post-Op, Procedure(s) (LRB): ROBOTIC ASSISTED LAPAROSCOPIC RADICAL PROSTATECTOMY (N/A)   Doing well Ambulate, Incentive spirometry DVT prophylaxis Transition to PO pain medications D/C pelvic  drain Dulcolax supp Poss d/c later today   LOS: 1 day   YARBROUGH,Evaleen Sant G. 04/29/2011, 7:30 AM

## 2011-04-29 NOTE — Discharge Summary (Signed)
  Date of admission: 04/28/2011  Date of discharge: 04/29/2011  Admission diagnosis: Prostate Cancer  Discharge diagnosis: Prostate Cancer  History and Physical: For full details, please see admission history and physical. Briefly, Eric Olsen is a 51 y.o. gentleman with localized prostate cancer.  After discussing management/treatment options, he elected to proceed with surgical treatment.  Hospital Course: Eric Olsen was taken to the operating room on 04/28/2011 and underwent a robotic assisted laparoscopic radical prostatectomy. He tolerated this procedure well and without complications. Postoperatively, he was able to be transferred to a regular hospital room following recovery from anesthesia.  He was able to begin ambulating the night of surgery. He remained hemodynamically stable overnight.  He had excellent urine output with appropriately minimal output from his pelvic drain and his pelvic drain was removed on POD #1.  He was transitioned to oral pain medication, tolerated a clear liquid diet, and had met all discharge criteria and was able to be discharged home later on POD#1.  Laboratory values:  Basename 04/29/11 0445 04/28/11 1231  HGB 13.3 14.2  HCT 38.2* 40.4    Disposition: Home  Discharge instruction: He was instructed to be ambulatory but to refrain from heavy lifting, strenuous activity, or driving. He was instructed on urethral catheter care.  Discharge medications:   Medication List  As of 04/29/2011  2:18 PM   START taking these medications         ciprofloxacin 500 MG tablet   Commonly known as: CIPRO   Take 1 tablet (500 mg total) by mouth 2 (two) times daily. Start day prior to office visit for foley removal      HYDROcodone-acetaminophen 5-325 MG per tablet   Commonly known as: NORCO   Take 1-2 tablets by mouth every 6 (six) hours as needed for pain.         CONTINUE taking these medications         albuterol 108 (90 BASE) MCG/ACT inhaler   Commonly known as: PROVENTIL HFA;VENTOLIN HFA      lisinopril 20 MG tablet   Commonly known as: PRINIVIL,ZESTRIL         STOP taking these medications         aspirin EC 81 MG tablet      fish oil-omega-3 fatty acids 1000 MG capsule      GARLIC PO      mulitivitamin with minerals Tabs          Where to get your medications    These are the prescriptions that you need to pick up.   You may get these medications from any pharmacy.         ciprofloxacin 500 MG tablet   HYDROcodone-acetaminophen 5-325 MG per tablet            Followup: He will followup in 1 week for catheter and staple removal and to discuss his surgical pathology results.

## 2013-05-17 ENCOUNTER — Telehealth: Payer: Self-pay | Admitting: Internal Medicine

## 2013-05-17 DIAGNOSIS — E782 Mixed hyperlipidemia: Secondary | ICD-10-CM

## 2013-05-17 NOTE — Telephone Encounter (Signed)
Is asking for a lab order before his yearly vist on 06/15/13 at 8am > Please send order to patient   Thanks

## 2013-05-17 NOTE — Telephone Encounter (Signed)
Lab(s) ordered and Lab slip mailed.  

## 2013-06-06 LAB — NMR LIPOPROFILE WITH LIPIDS
Cholesterol, Total: 111 mg/dL (ref <200)
HDL Particle Number: 28.9 umol/L — ABNORMAL LOW (ref >=30.5)
HDL SIZE: 8.7 nm — AB (ref >=9.2)
HDL-C: 37 mg/dL — AB (ref >=40)
LARGE HDL: 1.9 umol/L — AB (ref >=4.8)
LDL (calc): 50 mg/dL (ref <100)
LDL Particle Number: 688 nmol/L (ref <1000)
LDL SIZE: 20.1 nm — AB (ref >20.5)
LP-IR Score: 63 — ABNORMAL HIGH (ref <=45)
Large VLDL-P: 3.6 nmol/L — ABNORMAL HIGH (ref <=2.7)
SMALL LDL PARTICLE NUMBER: 438 nmol/L (ref <=527)
Triglycerides: 118 mg/dL (ref <150)
VLDL Size: 44.8 nm (ref <=46.6)

## 2013-06-08 ENCOUNTER — Encounter: Payer: Self-pay | Admitting: *Deleted

## 2013-06-14 ENCOUNTER — Encounter: Payer: Self-pay | Admitting: Internal Medicine

## 2013-06-15 ENCOUNTER — Ambulatory Visit (INDEPENDENT_AMBULATORY_CARE_PROVIDER_SITE_OTHER): Payer: 59 | Admitting: Internal Medicine

## 2013-06-15 ENCOUNTER — Encounter: Payer: Self-pay | Admitting: Internal Medicine

## 2013-06-15 VITALS — BP 130/92 | HR 83 | Ht 71.0 in | Wt 220.2 lb

## 2013-06-15 DIAGNOSIS — C61 Malignant neoplasm of prostate: Secondary | ICD-10-CM | POA: Insufficient documentation

## 2013-06-15 DIAGNOSIS — I10 Essential (primary) hypertension: Secondary | ICD-10-CM | POA: Insufficient documentation

## 2013-06-15 DIAGNOSIS — Z86718 Personal history of other venous thrombosis and embolism: Secondary | ICD-10-CM | POA: Insufficient documentation

## 2013-06-15 DIAGNOSIS — Z8546 Personal history of malignant neoplasm of prostate: Secondary | ICD-10-CM

## 2013-06-15 DIAGNOSIS — E785 Hyperlipidemia, unspecified: Secondary | ICD-10-CM

## 2013-06-15 NOTE — Progress Notes (Signed)
OFFICE NOTE  Chief Complaint:  Occasional muscle pain  Primary Care Physician: Lynne Logan, MD  HPI:  Eric Olsen  is a 53 year old gentleman who is a patient of yours who has a history of hypertension, dyslipidemia and prostate cancer which he has been cured of. He also had a history of DVT and PE in the past and was thought to be I guess due to hypercoagulable state. Overall he is doing very well. At last visit we were working on weight loss and he has managed to do that, down to 203 today from 212 pounds. Blood pressure remains well controlled at 128/84. He is exercising more often than he had been previously.   We did obtain a lipid profile on him at his last visit including particle numbers which showed an LDL particle number of 1578 despite the fact that his calculated LDL was 88 and his triglycerides were 130, total cholesterol 155 with an HDL of 41. While this appears to be a fairly normal lipid profile based on cholesterol content, the high particle number indicates excess risk. I did recommend starting Crestor 20 mg daily. He reports he's had some mild muscle pains with this and has decreased the dose to 10 mg every other day fairly recently. However he did have a recent lipid profile which shows a marked improvement. LDL particle numbers now decreased to 688, and LDL-C. is 50.  This is associated with a marked decrease in his risk for cardiovascular events.  PMHx:  Past Medical History  Diagnosis Date  . PONV (postoperative nausea and vomiting)   . Peripheral vascular disease     hx DVT 10 years ago following achilles tendon tear & PE-  last anticoagulation years ago  . Prostate cancer     prostate  . Recurrent upper respiratory infection (URI)     recent head cold- was on augumentin- states no fever- states resolved  . Asthma     states well controlled  . Hypertension     cardio Dr Debara Pickett-  LOV note 2/12 on chart  . Dyslipidemia   . History of DVT (deep vein  thrombosis)     following achilles tendon surgery  . History of pulmonary embolus (PE)     following achilles tendon surgery     Past Surgical History  Procedure Laterality Date  . Tonsillectomy    . Cardiac catheterization  03/16/2005    RCA is large & dominant, no lesions, EF 60% (Dr. Domenic Moras)  . Facial nerve decompression      right elbow- radial   . Robot assisted laparoscopic radical prostatectomy  04/28/2011    Procedure: ROBOTIC ASSISTED LAPAROSCOPIC RADICAL PROSTATECTOMY;  Surgeon: Bernestine Amass, MD;  Location: [facility];  Service: Urology;  Laterality: N/A;  . Transthoracic echocardiogram  2009    EF=>55%, borderline conc LVH, trace MR/TR  . Nm myocar perf wall motion  2006    persantine study - inferior wall thinning; EF 63%; low risk    FAMHx:  No family history on file.  SOCHx:   reports that he has never smoked. He has never used smokeless tobacco. He reports that he does not drink alcohol or use illicit drugs.  ALLERGIES:  No Known Allergies  ROS: A comprehensive review of systems was negative except for: Musculoskeletal: positive for myalgias  HOME MEDS: Current Outpatient Prescriptions  Medication Sig Dispense Refill  . albuterol (PROVENTIL HFA;VENTOLIN HFA) 108 (90 BASE) MCG/ACT inhaler Inhale 2 puffs into the  lungs every 6 (six) hours as needed. Wheezing       . aspirin 81 MG tablet Take 81 mg by mouth daily.      Marland Kitchen CALCIUM PO Take by mouth daily.      . Garlic 3235 MG CAPS Take 1 capsule by mouth daily.      Marland Kitchen lisinopril (PRINIVIL,ZESTRIL) 20 MG tablet Take 20 mg by mouth daily after breakfast.      . Multiple Vitamin (MULTIVITAMIN) capsule Take 1 capsule by mouth daily.      . Omega-3 Fatty Acids (FISH OIL PO) Take 500 mg by mouth daily.      . rosuvastatin (CRESTOR) 20 MG tablet Take 10 mg by mouth daily.       . tadalafil (CIALIS) 5 MG tablet Take 5 mg by mouth daily as needed for erectile dysfunction.       No current facility-administered  medications for this visit.    LABS/IMAGING: No results found for this or any previous visit (from the past 48 hour(s)). No results found.  VITALS: BP 130/92  Pulse 83  Ht 5\' 11"  (1.803 m)  Wt 220 lb 3.2 oz (99.882 kg)  BMI 30.73 kg/m2  EXAM: General appearance: alert and no distress Neck: no carotid bruit and no JVD Lungs: clear to auscultation bilaterally Heart: regular rate and rhythm, S1, S2 normal, no murmur, click, rub or gallop Abdomen: soft, non-tender; bowel sounds normal; no masses,  no organomegaly Extremities: extremities normal, atraumatic, no cyanosis or edema Pulses: 2+ and symmetric Skin: Skin color, texture, turgor normal. No rashes or lesions Neurologic: Grossly normal Psych: Mood, affect normal  EKG: Normal sinus rhythm at 83  ASSESSMENT: 1. Hypertension-controlled 2. Dyslipidemia at goal  3. History prostate cancer  PLAN: 1.   [name] has good control of his blood pressure. Note changes his medications are necessary. His cholesterol is markedly improved however he may be having some mild myalgias. I'm okay with decreasing his dose of Crestor to 10 mg daily which should keep him still with an LDL particle number less than 1000. He is agreeable to make this change. He continues to have muscle pain issues we may consider changing him to an alternative statin such as atorvastatin. Plan to see him back annually or sooner as necessary.  Pixie Casino, MD, [facility] Attending Cardiologist [facility]  Pama Roskos C 06/15/2013, 11:17 AM

## 2013-06-15 NOTE — Patient Instructions (Signed)
Your physician wants you to follow-up in: 1 year.  You will receive a reminder letter in the mail two months in advance. If you don't receive a letter, please call our office to schedule the follow-up appointment.  Crestor 10mg  every day.

## 2013-06-23 ENCOUNTER — Other Ambulatory Visit: Payer: Self-pay | Admitting: Internal Medicine

## 2013-06-25 NOTE — Telephone Encounter (Signed)
Rx was sent to pharmacy electronically. 

## 2013-07-03 ENCOUNTER — Other Ambulatory Visit: Payer: Self-pay | Admitting: Internal Medicine

## 2013-07-04 NOTE — Telephone Encounter (Signed)
Rx was sent to pharmacy electronically. 

## 2014-05-02 ENCOUNTER — Telehealth: Payer: Self-pay | Admitting: Internal Medicine

## 2014-05-02 DIAGNOSIS — E785 Hyperlipidemia, unspecified: Secondary | ICD-10-CM

## 2014-05-02 NOTE — Telephone Encounter (Signed)
Pt coming in for annual visit in March. Had lipid profile ordered last year. Spoke w/ Eliezer Lofts, advised to order same.  Labs ordered for [employer].  Called pt, left message on machine to call us back.

## 2014-05-02 NOTE — Telephone Encounter (Signed)
Pt called in stating that he will need some lab orders put in for him before he comes in to see Dr. Debara Pickett on 3/30.  Thanks

## 2014-05-06 NOTE — Telephone Encounter (Signed)
Called, left voice mail w/ instructions.

## 2014-06-04 ENCOUNTER — Encounter: Payer: Self-pay | Admitting: Internal Medicine

## 2014-06-26 ENCOUNTER — Ambulatory Visit (INDEPENDENT_AMBULATORY_CARE_PROVIDER_SITE_OTHER): Payer: 59 | Admitting: Internal Medicine

## 2014-06-26 ENCOUNTER — Encounter: Payer: Self-pay | Admitting: Internal Medicine

## 2014-06-26 VITALS — BP 118/80 | HR 79 | Ht 71.0 in | Wt 225.6 lb

## 2014-06-26 DIAGNOSIS — I10 Essential (primary) hypertension: Secondary | ICD-10-CM

## 2014-06-26 DIAGNOSIS — E785 Hyperlipidemia, unspecified: Secondary | ICD-10-CM

## 2014-06-26 DIAGNOSIS — Z86718 Personal history of other venous thrombosis and embolism: Secondary | ICD-10-CM | POA: Diagnosis not present

## 2014-06-26 DIAGNOSIS — G4700 Insomnia, unspecified: Secondary | ICD-10-CM | POA: Diagnosis not present

## 2014-06-26 MED ORDER — ROSUVASTATIN CALCIUM 20 MG PO TABS: 10.0000 mg | ORAL_TABLET | Freq: Every day | ORAL | Status: DC

## 2014-06-26 MED ORDER — LISINOPRIL 20 MG PO TABS: 20.0000 mg | ORAL_TABLET | Freq: Every day | ORAL | Status: DC

## 2014-06-26 NOTE — Progress Notes (Signed)
OFFICE NOTE  Chief Complaint:  insomnia  Primary Care Physician: Lynne Logan, MD  HPI:  Eric Olsen  is a 54 year old gentleman who is a patient of yours who has a history of hypertension, dyslipidemia and prostate cancer which he has been cured of. He also had a history of DVT and PE in the past and was thought to be I guess due to hypercoagulable state. Overall he is doing very well. At last visit we were working on weight loss and he has managed to do that, down to 203 today from 212 pounds. Blood pressure remains well controlled at 128/84. He is exercising more often than he had been previously.   We did obtain a lipid profile on him at his last visit including particle numbers which showed an LDL particle number of 1578 despite the fact that his calculated LDL was 88 and his triglycerides were 130, total cholesterol 155 with an HDL of 41. While this appears to be a fairly normal lipid profile based on cholesterol content, the high particle number indicates excess risk. I did recommend starting Crestor 20 mg daily. He reports he's had some mild muscle pains with this and has decreased the dose to 10 mg every other day fairly recently. However he did have a recent lipid profile which shows a marked improvement. LDL particle numbers now decreased to 688, and LDL-C. is 50.  This is associated with a marked decrease in his risk for cardiovascular events.  I saw Eric Olsen back in the office today. He is doing well and denies any chest pain or shortness of breath. He is reporting some insomnia. This lead to some daytime fatigue and occasionally naps at lunch time. Unfortunately he's gained back about 20 pounds and needs to continue to work on weight loss. He says he started to do some walking at lunch and in the evenings. He had recent blood work performed in March 2016 which showed total cholesterol 102, triglycerides 108, HDL 35 and LDL 45. This is after decreasing his Crestor down to 10  mg daily.  PMHx:  Past Medical History  Diagnosis Date  . PONV (postoperative nausea and vomiting)   . Peripheral vascular disease     hx DVT 10 years ago following achilles tendon tear & PE-  last anticoagulation years ago  . Prostate cancer     prostate  . Recurrent upper respiratory infection (URI)     recent head cold- was on augumentin- states no fever- states resolved  . Asthma     states well controlled  . Hypertension     cardio Dr Debara Pickett-  LOV note 2/12 on chart  . Dyslipidemia   . History of DVT (deep vein thrombosis)     following achilles tendon surgery  . History of pulmonary embolus (PE)     following achilles tendon surgery     Past Surgical History  Procedure Laterality Date  . Tonsillectomy    . Cardiac catheterization  03/16/2005    RCA is large & dominant, no lesions, EF 60% (Dr. Domenic Moras)  . Facial nerve decompression      right elbow- radial   . Robot assisted laparoscopic radical prostatectomy  04/28/2011    Procedure: ROBOTIC ASSISTED LAPAROSCOPIC RADICAL PROSTATECTOMY;  Surgeon: Bernestine Amass, MD;  Location: [facility];  Service: Urology;  Laterality: N/A;  . Transthoracic echocardiogram  2009    EF=>55%, borderline conc LVH, trace MR/TR  . Nm myocar perf wall motion  2006    persantine study - inferior wall thinning; EF 63%; low risk    FAMHx:  No family history on file.  SOCHx:   reports that he has never smoked. He has never used smokeless tobacco. He reports that he does not drink alcohol or use illicit drugs.  ALLERGIES:  No Known Allergies  ROS: A comprehensive review of systems was negative.  HOME MEDS: Current Outpatient Prescriptions  Medication Sig Dispense Refill  . albuterol (PROVENTIL HFA;VENTOLIN HFA) 108 (90 BASE) MCG/ACT inhaler Inhale 2 puffs into the lungs every 6 (six) hours as needed. Wheezing     . aspirin 81 MG tablet Take 81 mg by mouth daily.    Marland Kitchen CALCIUM PO Take by mouth daily.    . Garlic 0321 MG CAPS Take 1  capsule by mouth daily.    Marland Kitchen lisinopril (PRINIVIL,ZESTRIL) 20 MG tablet Take 1 tablet (20 mg total) by mouth daily. 90 tablet 3  . Multiple Vitamin (MULTIVITAMIN) capsule Take 1 capsule by mouth daily.    . Omega-3 Fatty Acids (FISH OIL PO) Take 500 mg by mouth daily.    . rosuvastatin (CRESTOR) 20 MG tablet Take 0.5 tablets (10 mg total) by mouth daily. 30 tablet 6  . tadalafil (CIALIS) 5 MG tablet Take 5 mg by mouth daily as needed for erectile dysfunction.     No current facility-administered medications for this visit.    LABS/IMAGING: No results found for this or any previous visit (from the past 48 hour(s)). No results found.  VITALS: BP 118/80 mmHg  Pulse 79  Ht 5\' 11"  (1.803 m)  Wt 225 lb 9.6 oz (102.331 kg)  BMI 31.48 kg/m2  EXAM: General appearance: alert and no distress Neck: no carotid bruit and no JVD Lungs: clear to auscultation bilaterally Heart: regular rate and rhythm, S1, S2 normal, no murmur, click, rub or gallop Abdomen: soft, non-tender; bowel sounds normal; no masses,  no organomegaly Extremities: extremities normal, atraumatic, no cyanosis or edema Pulses: 2+ and symmetric Skin: Skin color, texture, turgor normal. No rashes or lesions Neurologic: Grossly normal Psych: Mood, affect normal  EKG: Normal sinus rhythm at 79  ASSESSMENT: 1. Hypertension-controlled 2. Dyslipidemia at goal  3. History prostate cancer  PLAN: 1.   [name] has good control of his blood pressure. Cholesterol is well controlled. He could even conceivably go to 5 mg of Crestor, but I like to keep him at where he is at now. Hopefully he can work on weight loss and if he loses 20-30 pounds we may be able to further decrease his cholesterol medications. We may also be able to decrease his blood pressure medications. Plan to see him back annually or sooner as necessary.  Eric Casino, MD, [facility] Attending Cardiologist [facility]  Raywood Wailes C 06/26/2014, 8:43 AM

## 2014-06-26 NOTE — Patient Instructions (Signed)
Your physician wants you to follow-up in: 1 year with Dr. Hilty. You will receive a reminder letter in the mail two months in advance. If you don't receive a letter, please call our office to schedule the follow-up appointment.  

## 2014-07-25 ENCOUNTER — Other Ambulatory Visit: Payer: Self-pay | Admitting: Gastroenterology

## 2015-02-15 ENCOUNTER — Emergency Department (HOSPITAL_COMMUNITY)
Admission: EM | Admit: 2015-02-15 | Discharge: 2015-02-15 | Disposition: A | Discharge status: Home / Self Care | Payer: 59 | Source: Home / Self Care | Attending: Emergency Medicine | Admitting: Emergency Medicine

## 2015-02-15 ENCOUNTER — Encounter (HOSPITAL_COMMUNITY): Payer: Self-pay | Admitting: Emergency Medicine

## 2015-02-15 ENCOUNTER — Encounter (HOSPITAL_COMMUNITY): Payer: Self-pay | Admitting: *Deleted

## 2015-02-15 ENCOUNTER — Emergency Department (HOSPITAL_COMMUNITY)
Admission: EM | Admit: 2015-02-15 | Discharge: 2015-02-15 | Disposition: A | Discharge status: Home / Self Care | Payer: 59 | Attending: Emergency Medicine | Admitting: Emergency Medicine

## 2015-02-15 DIAGNOSIS — Z8546 Personal history of malignant neoplasm of prostate: Secondary | ICD-10-CM | POA: Insufficient documentation

## 2015-02-15 DIAGNOSIS — I10 Essential (primary) hypertension: Secondary | ICD-10-CM | POA: Insufficient documentation

## 2015-02-15 DIAGNOSIS — R112 Nausea with vomiting, unspecified: Secondary | ICD-10-CM

## 2015-02-15 DIAGNOSIS — Z9889 Other specified postprocedural states: Secondary | ICD-10-CM

## 2015-02-15 DIAGNOSIS — I739 Peripheral vascular disease, unspecified: Secondary | ICD-10-CM | POA: Insufficient documentation

## 2015-02-15 DIAGNOSIS — Z86718 Personal history of other venous thrombosis and embolism: Secondary | ICD-10-CM

## 2015-02-15 DIAGNOSIS — Z79899 Other long term (current) drug therapy: Secondary | ICD-10-CM

## 2015-02-15 DIAGNOSIS — J45909 Unspecified asthma, uncomplicated: Secondary | ICD-10-CM | POA: Insufficient documentation

## 2015-02-15 DIAGNOSIS — R1013 Epigastric pain: Secondary | ICD-10-CM | POA: Insufficient documentation

## 2015-02-15 DIAGNOSIS — R000 Tachycardia, unspecified: Secondary | ICD-10-CM | POA: Insufficient documentation

## 2015-02-15 DIAGNOSIS — J45901 Unspecified asthma with (acute) exacerbation: Secondary | ICD-10-CM | POA: Insufficient documentation

## 2015-02-15 DIAGNOSIS — Z7982 Long term (current) use of aspirin: Secondary | ICD-10-CM

## 2015-02-15 DIAGNOSIS — E785 Hyperlipidemia, unspecified: Secondary | ICD-10-CM | POA: Insufficient documentation

## 2015-02-15 DIAGNOSIS — R1110 Vomiting, unspecified: Secondary | ICD-10-CM

## 2015-02-15 DIAGNOSIS — Z86711 Personal history of pulmonary embolism: Secondary | ICD-10-CM

## 2015-02-15 LAB — URINE MICROSCOPIC-ADD ON

## 2015-02-15 LAB — COMPREHENSIVE METABOLIC PANEL
ALBUMIN: 3.9 g/dL (ref 3.5–5.0)
ALBUMIN: 3.7 g/dL (ref 3.5–5.0)
ALK PHOS: 51 U/L (ref 38–126)
ALT: 13 U/L — AB (ref 17–63)
ALT: 123 U/L — AB (ref 17–63)
AST: 27 U/L (ref 15–41)
Alkaline Phosphatase: 105 U/L (ref 38–126)
Anion gap: 9 (ref 5–15)
Anion gap: 8 (ref 5–15)
BILIRUBIN TOTAL: 2.5 mg/dL — AB (ref 0.3–1.2)
BUN: 12 mg/dL (ref 6–20)
BUN: 10 mg/dL (ref 6–20)
CALCIUM: 9.3 mg/dL (ref 8.9–10.3)
CHLORIDE: 104 mmol/L (ref 101–111)
CHLORIDE: 103 mmol/L (ref 101–111)
CO2: 25 mmol/L (ref 22–32)
CO2: 26 mmol/L (ref 22–32)
CREATININE: 0.92 mg/dL (ref 0.61–1.24)
CREATININE: 1.03 mg/dL (ref 0.61–1.24)
Calcium: 9.2 mg/dL (ref 8.9–10.3)
GFR calc Af Amer: >60 mL/min (ref >60)
GFR calc non Af Amer: >60 mL/min (ref >60)
GFR calc non Af Amer: >60 mL/min (ref >60)
GLUCOSE: 115 mg/dL — AB (ref 65–99)
GLUCOSE: 132 mg/dL — AB (ref 65–99)
POTASSIUM: 4.1 mmol/L (ref 3.5–5.1)
Potassium: 4.3 mmol/L (ref 3.5–5.1)
SODIUM: 138 mmol/L (ref 135–145)
Sodium: 137 mmol/L (ref 135–145)
TOTAL PROTEIN: 6.8 g/dL (ref 6.5–8.1)
Total Bilirubin: 0.5 mg/dL (ref 0.3–1.2)
Total Protein: 7.1 g/dL (ref 6.5–8.1)

## 2015-02-15 LAB — CBC
HCT: 42.2 % (ref 39.0–52.0)
HEMATOCRIT: 43.1 % (ref 39.0–52.0)
Hemoglobin: 14.5 g/dL (ref 13.0–17.0)
Hemoglobin: 15 g/dL (ref 13.0–17.0)
MCH: 32.4 pg (ref 26.0–34.0)
MCH: 33 pg (ref 26.0–34.0)
MCHC: 34.4 g/dL (ref 30.0–36.0)
MCHC: 34.8 g/dL (ref 30.0–36.0)
MCV: 94.4 fL (ref 78.0–100.0)
MCV: 94.9 fL (ref 78.0–100.0)
PLATELETS: 186 10*3/uL (ref 150–400)
PLATELETS: 177 10*3/uL (ref 150–400)
RBC: 4.47 MIL/uL (ref 4.22–5.81)
RBC: 4.54 MIL/uL (ref 4.22–5.81)
RDW: 12.1 % (ref 11.5–15.5)
RDW: 12.4 % (ref 11.5–15.5)
WBC: 11.3 10*3/uL — ABNORMAL HIGH (ref 4.0–10.5)
WBC: 13.8 10*3/uL — ABNORMAL HIGH (ref 4.0–10.5)

## 2015-02-15 LAB — URINALYSIS, ROUTINE W REFLEX MICROSCOPIC
Glucose, UA: NEGATIVE mg/dL
Hgb urine dipstick: NEGATIVE
Ketones, ur: 15 mg/dL — AB
Nitrite: NEGATIVE
PH: 6.5 (ref 5.0–8.0)
Protein, ur: 30 mg/dL — AB
Specific Gravity, Urine: 1.023 (ref 1.005–1.030)

## 2015-02-15 LAB — LIPASE, BLOOD
LIPASE: 25 U/L (ref 11–51)
Lipase: 23 U/L (ref 11–51)

## 2015-02-15 MED ORDER — ONDANSETRON HCL 4 MG/2ML IJ SOLN
4.0000 mg | Freq: Once | INTRAMUSCULAR | Status: AC
  Administered 2015-02-15: 4 mg via INTRAVENOUS
  Filled 2015-02-15: qty 2

## 2015-02-15 MED ORDER — DICYCLOMINE HCL 20 MG PO TABS: 20.0000 mg | ORAL_TABLET | Freq: Two times a day (BID) | ORAL | Status: DC

## 2015-02-15 MED ORDER — OMEPRAZOLE 40 MG PO CPDR: 40.0000 mg | DELAYED_RELEASE_CAPSULE | Freq: Every day | ORAL | Status: DC

## 2015-02-15 MED ORDER — GI COCKTAIL ~~LOC~~
30.0000 mL | Freq: Once | ORAL | Status: AC
  Administered 2015-02-15: 30 mL via ORAL
  Filled 2015-02-15: qty 30

## 2015-02-15 MED ORDER — SODIUM CHLORIDE 0.9 % IV BOLUS (SEPSIS)
1000.0000 mL | Freq: Once | INTRAVENOUS | Status: AC
  Administered 2015-02-15: 1000 mL via INTRAVENOUS

## 2015-02-15 MED ORDER — HYDROCODONE-ACETAMINOPHEN 5-325 MG PO TABS: 1.0000 | ORAL_TABLET | ORAL | Status: DC | PRN

## 2015-02-15 MED ORDER — PANTOPRAZOLE SODIUM 40 MG IV SOLR
40.0000 mg | Freq: Once | INTRAVENOUS | Status: AC
  Administered 2015-02-15: 40 mg via INTRAVENOUS
  Filled 2015-02-15: qty 40

## 2015-02-15 MED ORDER — FAMOTIDINE IN NACL 20-0.9 MG/50ML-% IV SOLN
20.0000 mg | Freq: Once | INTRAVENOUS | Status: AC
Start: 2015-02-15 — End: 2015-02-15
  Administered 2015-02-15: 20 mg via INTRAVENOUS
  Filled 2015-02-15: qty 50

## 2015-02-15 MED ORDER — FENTANYL CITRATE (PF) 100 MCG/2ML IJ SOLN
50.0000 ug | Freq: Once | INTRAMUSCULAR | Status: AC
  Administered 2015-02-15: 50 ug via INTRAVENOUS
  Filled 2015-02-15: qty 2

## 2015-02-15 NOTE — Discharge Instructions (Signed)
Abdominal Pain, Adult Follow-up with your primary care physician and gastroenterology. Many things can cause belly (abdominal) pain. Most times, the belly pain is not dangerous. Many cases of belly pain can be watched and treated at home. HOME CARE   Do not take medicines that help you go poop (laxatives) unless told to by your doctor.  Only take medicine as told by your doctor.  Eat or drink as told by your doctor. Your doctor will tell you if you should be on a special diet. GET HELP IF:  You do not know what is causing your belly pain.  You have belly pain while you are sick to your stomach (nauseous) or have runny poop (diarrhea).  You have pain while you pee or poop.  Your belly pain wakes you up at night.  You have belly pain that gets worse or better when you eat.  You have belly pain that gets worse when you eat fatty foods.  You have a fever. GET HELP RIGHT AWAY IF:   The pain does not go away within 2 hours.  You keep throwing up (vomiting).  The pain changes and is only in the right or left part of the belly.  You have bloody or tarry looking poop. MAKE SURE YOU:   Understand these instructions.  Will watch your condition.  Will get help right away if you are not doing well or get worse.   This information is not intended to replace advice given to you by your health care provider. Make sure you discuss any questions you have with your health care provider.   Document Released: 09/01/2007 Document Revised: 04/05/2014 Document Reviewed: 11/22/2012 Elsevier Interactive Patient Education [employer].

## 2015-02-15 NOTE — ED Provider Notes (Signed)
CSN: [id number]     Arrival date & time 02/15/15  0654 History   First MD Initiated Contact with Patient 02/15/15 [phone]     Chief Complaint  Patient presents with  . Abdominal Pain    (Consider location/radiation/quality/duration/timing/severity/associated sxs/prior Treatment) The history is provided by the patient and the spouse. No language interpreter was used.  Mr. Eric Olsen is a 54 year old male with a history of PVD, asthma, hypertension, hyperlipidemia, DVT and PE who presents with worsening, burning epigastric abdominal pain since 11:00 last night with 5 episodes of nonbloody emesis. He states that he ate fried chicken last night. No treatment prior to arrival. Pain is 8/10 after he was given fentanyl and Zofran prior to me evaluating the patient. He denies any recent illness, fever, chills, shortness of breath, chest pain, diarrhea, constipation, hematochezia, or dysuria.  Past Medical History  Diagnosis Date  . PONV (postoperative nausea and vomiting)   . Peripheral vascular disease (HCC)     hx DVT 10 years ago following achilles tendon tear & PE-  last anticoagulation years ago  . Prostate cancer [facility])     prostate  . Recurrent upper respiratory infection (URI)     recent head cold- was on augumentin- states no fever- states resolved  . Asthma     states well controlled  . Hypertension     cardio Dr Debara Pickett-  LOV note 2/12 on chart  . Dyslipidemia   . History of DVT (deep vein thrombosis)     following achilles tendon surgery  . History of pulmonary embolus (PE)     following achilles tendon surgery    Past Surgical History  Procedure Laterality Date  . Tonsillectomy    . Cardiac catheterization  03/16/2005    RCA is large & dominant, no lesions, EF 60% (Dr. Domenic Moras)  . Facial nerve decompression      right elbow- radial   . Robot assisted laparoscopic radical prostatectomy  04/28/2011    Procedure: ROBOTIC ASSISTED LAPAROSCOPIC RADICAL PROSTATECTOMY;  Surgeon: Bernestine Amass, MD;  Location: [facility];  Service: Urology;  Laterality: N/A;  . Transthoracic echocardiogram  2009    EF=>55%, borderline conc LVH, trace MR/TR  . Nm myocar perf wall motion  2006    persantine study - inferior wall thinning; EF 63%; low risk   No family history on file. Social History  Substance Use Topics  . Smoking status: Never Smoker   . Smokeless tobacco: Never Used  . Alcohol Use: No    Review of Systems  Constitutional: Negative for fever and chills.  Cardiovascular: Negative for chest pain.  Gastrointestinal: Positive for nausea, vomiting and abdominal pain. Negative for diarrhea, constipation, blood in stool and abdominal distention.  All other systems reviewed and are negative.     Allergies  Review of patient's allergies indicates no known allergies.  Home Medications   Prior to Admission medications   Medication Sig Start Date End Date Taking? Authorizing Provider  albuterol (PROVENTIL HFA;VENTOLIN HFA) 108 (90 BASE) MCG/ACT inhaler Inhale 2 puffs into the lungs every 6 (six) hours as needed. Wheezing    Yes Historical Provider, MD  Ascorbic Acid (VITAMIN C) 1000 MG tablet Take 1,000 mg by mouth daily.   Yes Historical Provider, MD  aspirin 81 MG tablet Take 81 mg by mouth daily.   Yes Historical Provider, MD  Garlic [id number] MG CAPS Take 1 capsule by mouth daily.   Yes Historical Provider, MD  lisinopril (PRINIVIL,ZESTRIL) 20 MG tablet  Take 1 tablet (20 mg total) by mouth daily. 06/26/14  Yes Pixie Casino, MD  Multiple Vitamin (MULTIVITAMIN) capsule Take 1 capsule by mouth daily.   Yes Historical Provider, MD  Omega-3 Krill Oil 1000 MG CAPS Take 1 capsule by mouth daily.   Yes Historical Provider, MD  rosuvastatin (CRESTOR) 20 MG tablet Take 0.5 tablets (10 mg total) by mouth daily. 06/26/14  Yes Pixie Casino, MD  dicyclomine (BENTYL) 20 MG tablet Take 1 tablet (20 mg total) by mouth 2 (two) times daily. 02/15/15   Rakhi Romagnoli Patel-Mills, PA-C  tadalafil  (CIALIS) 5 MG tablet Take 5 mg by mouth daily as needed for erectile dysfunction.    Historical Provider, MD   BP 137/97 mmHg  Pulse 95  Temp(Src) 97.5 F (36.4 C) (Oral)  Resp 15  SpO2 99% Physical Exam  Constitutional: He is oriented to person, place, and time. He appears well-developed and well-nourished.  HENT:  Head: Normocephalic and atraumatic.  Eyes: Conjunctivae are normal.  Neck: Normal range of motion. Neck supple.  Cardiovascular: Normal rate, regular rhythm and normal heart sounds.   Pulmonary/Chest: Effort normal. He has no decreased breath sounds. He has wheezes.  Left lung field wheezing. No decreased breath sounds. No respiratory distress or complaints of shortness of breath.  Abdominal: Soft. Normal appearance and bowel sounds are normal. There is tenderness in the epigastric area. There is no rebound and no guarding.  Appears uncomfortable with his knees flexed. Epigastric tenderness to palpation. No guarding or rebound. No abdominal distention. Normal bowel sounds.  Musculoskeletal: Normal range of motion.  Neurological: He is alert and oriented to person, place, and time.  Skin: Skin is warm and dry.  Psychiatric: He has a normal mood and affect.  Nursing note and vitals reviewed.   ED Course  Procedures (including critical care time) Labs Review Labs Reviewed  COMPREHENSIVE METABOLIC PANEL - Abnormal; Notable for the following:    Glucose, Bld 115 (*)    ALT 13 (*)    All other components within normal limits  CBC - Abnormal; Notable for the following:    WBC 11.3 (*)    All other components within normal limits  LIPASE, BLOOD    Imaging Review No results found. I have personally reviewed and evaluated these lab results as part of my medical decision-making.   EKG Interpretation None      MDM   Final diagnoses:  Epigastric abdominal pain  Non-intractable vomiting with nausea, vomiting of unspecified type   Patient presents for epigastric  burning abdominal pain and emesis 5 since last night. He appears uncomfortable but vital signs are stable. His pain is reproducible. He has wheezing in the left lung fields but states that he has asthma and this is normal for him. He denies any shortness of breath or cough. I do not believe he needs imaging for this and that this is chronic in nature. He is asymptomatic. He was given fentanyl and Zofran prior to my evaluation of him. He states he is still in pain. He was given Pepcid and a GI cocktail. Medications  fentaNYL (SUBLIMAZE) injection 50 mcg (50 mcg Intravenous Given 02/15/15 0725)  ondansetron (ZOFRAN) injection 4 mg (4 mg Intravenous Given 02/15/15 0725)  famotidine (PEPCID) IVPB 20 mg premix (0 mg Intravenous Stopped 02/15/15 0828)  gi cocktail (Maalox,Lidocaine,Donnatal) (30 mLs Oral Given 02/15/15 0741)  sodium chloride 0.9 % bolus 1,000 mL (0 mLs Intravenous Stopped 02/15/15 0908)  Recheck: Patient was rehydrated and  given GI cocktail. He states he no longer has pain and is feeling much better now. This is most likely gastritis. I do not believe that this is bleeding stomach ulcer, colitis, cholecystitis, pancreatitis, or appendicitis. His labs were unremarkable. Rx: Bentyl. I discussed return precautions with the patient. I also gave him GI follow-up. He verbally agrees with the plan. Filed Vitals:   02/15/15 0930  BP: 137/97  Pulse: 95  Temp:   Resp: [id number] PA-C 02/15/15 1300  Charlesetta Shanks, MD 02/26/15 1458

## 2015-02-15 NOTE — ED Notes (Signed)
Pt was seen here this am for same. Having mid abd pain and n/v. Was diagnosed with gas pains. No relief with meds given and return of n/v.

## 2015-02-15 NOTE — Discharge Instructions (Signed)
As we discussed, all of your labs look normal in the ER today. Please follow-up with [facility] early next week for follow-up and further evaluation. I suspect your abdominal pain is due to acid reflux. In the meantime, please begin taking omeprazole (Prilosec) 40mg  daily. You may also take Pepcid 20mg  twice a day in addition to the omeprazole. Per your request, I will give you a short course of vicodin to help with the pain. Make sure to take it with food and do not combine with alcohol. Do not drive while taking Vicodin.   Please obtain all of your results from medical records or have your doctors office obtain the results - share them with your doctor - you should be seen at your doctors office in the next 2 days. Call today to arrange your follow up. Take the medications as prescribed. Please review all of the medicines and only take them if you do not have an allergy to them. Please be aware that if you are taking birth control pills, taking other prescriptions, ESPECIALLY ANTIBIOTICS may make the birth control ineffective - if this is the case, either do not engage in sexual activity or use alternative methods of birth control such as condoms until you have finished the medicine and your family doctor says it is OK to restart them. If you are on a blood thinner such as COUMADIN, be aware that any other medicine that you take may cause the coumadin to either work too much, or not enough - you should have your coumadin level rechecked in next 7 days if this is the case.  ?  It is also a possibility that you have an allergic reaction to any of the medicines that you have been prescribed - Everybody reacts differently to medications and while MOST people have no trouble with most medicines, you may have a reaction such as nausea, vomiting, rash, swelling, shortness of breath. If this is the case, please stop taking the medicine immediately and contact your physician.  ?  You should return to the ER if you  develop severe or worsening symptoms.

## 2015-02-15 NOTE — ED Notes (Signed)
Pt arrives with mid abdominal pain since yesterday at 2300, emesis x5 with no blood present. States no diarrhea.

## 2015-02-17 NOTE — ED Provider Notes (Signed)
CSN: [id number]     Arrival date & time 02/15/15  1855 History   First MD Initiated Contact with Patient 02/15/15 1956     Chief Complaint  Patient presents with  . Abdominal Pain     HPI  Eric Olsen is an 54 y.o. male with history of PVD, prostate cancer, asthma, HTN, HLD, who presents to the ED for evaluation of epigastric abdominal pain. He was seen in this ED earlier today for same complaint. His workup at that time had been unrevealing. He was d/c home with pepcid, bentyl, and GI cocktail on board. Pt reports he initially had complete pain relief but starting about one hour ago his pain is back and is very severe. Pt reports he has had severe epigastric pain since last night around 11 PM. States that the pain is so severe that he will throw up. Reports 4-5 NBNB episodes of emesis. STates that he doesn't necessarily have nausea but the pain will flare up and get so bad he needs to throw up. States he has not eaten or drank much the past 24h due to the pain.  Denies any diarrhea or bloody stools. He does endorse a history of mild GERD for which he takes Zantac on a prn basis. He has never had formal GI evaluation. Pt denies fever, chills, urinary problems, chest pain, SOB. Denies history  Of ulcers. Denies tobacco and EtOH use.  Past Medical History  Diagnosis Date  . PONV (postoperative nausea and vomiting)   . Peripheral vascular disease (HCC)     hx DVT 10 years ago following achilles tendon tear & PE-  last anticoagulation years ago  . Prostate cancer [facility])     prostate  . Recurrent upper respiratory infection (URI)     recent head cold- was on augumentin- states no fever- states resolved  . Asthma     states well controlled  . Hypertension     cardio Dr Debara Pickett-  LOV note 2/12 on chart  . Dyslipidemia   . History of DVT (deep vein thrombosis)     following achilles tendon surgery  . History of pulmonary embolus (PE)     following achilles tendon surgery    Past Surgical History   Procedure Laterality Date  . Tonsillectomy    . Cardiac catheterization  03/16/2005    RCA is large & dominant, no lesions, EF 60% (Dr. Domenic Moras)  . Facial nerve decompression      right elbow- radial   . Robot assisted laparoscopic radical prostatectomy  04/28/2011    Procedure: ROBOTIC ASSISTED LAPAROSCOPIC RADICAL PROSTATECTOMY;  Surgeon: Bernestine Amass, MD;  Location: [facility];  Service: Urology;  Laterality: N/A;  . Transthoracic echocardiogram  2009    EF=>55%, borderline conc LVH, trace MR/TR  . Nm myocar perf wall motion  2006    persantine study - inferior wall thinning; EF 63%; low risk   History reviewed. No pertinent family history. Social History  Substance Use Topics  . Smoking status: Never Smoker   . Smokeless tobacco: Never Used  . Alcohol Use: No    Review of Systems  All other systems reviewed and are negative.     Allergies  Review of patient's allergies indicates no known allergies.  Home Medications   Prior to Admission medications   Medication Sig Start Date End Date Taking? Authorizing Provider  albuterol (PROVENTIL HFA;VENTOLIN HFA) 108 (90 BASE) MCG/ACT inhaler Inhale 2 puffs into the lungs every 6 (six) hours as  needed. Wheezing    Yes Historical Provider, MD  Ascorbic Acid (VITAMIN C) 1000 MG tablet Take 1,000 mg by mouth daily.   Yes Historical Provider, MD  aspirin 81 MG tablet Take 81 mg by mouth daily.   Yes Historical Provider, MD  dicyclomine (BENTYL) 20 MG tablet Take 1 tablet (20 mg total) by mouth 2 (two) times daily. 02/15/15  Yes Hanna Patel-Mills, PA-C  Garlic [id number] MG CAPS Take 1 capsule by mouth daily.   Yes Historical Provider, MD  guaiFENesin (MUCINEX) 600 MG 12 hr tablet Take 600 mg by mouth 2 (two) times daily as needed for cough or to loosen phlegm.   Yes Historical Provider, MD  lisinopril (PRINIVIL,ZESTRIL) 20 MG tablet Take 1 tablet (20 mg total) by mouth daily. 06/26/14  Yes Pixie Casino, MD  Multiple Vitamin (MULTIVITAMIN)  capsule Take 1 capsule by mouth daily.   Yes Historical Provider, MD  Omega-3 Krill Oil 1000 MG CAPS Take 1 capsule by mouth daily.   Yes Historical Provider, MD  Pseudoephedrine-APAP-DM (DAYQUIL MULTI-SYMPTOM COLD/FLU PO) Take 2 capsules by mouth 2 (two) times daily as needed (for cold).   Yes Historical Provider, MD  rosuvastatin (CRESTOR) 20 MG tablet Take 0.5 tablets (10 mg total) by mouth daily. 06/26/14  Yes Pixie Casino, MD  tadalafil (CIALIS) 5 MG tablet Take 5 mg by mouth daily as needed for erectile dysfunction.   Yes Historical Provider, MD  HYDROcodone-acetaminophen (NORCO/VICODIN) 5-325 MG tablet Take 1 tablet by mouth every 4 (four) hours as needed. 02/15/15   Olivia Canter Kallen Mccrystal, PA-C  omeprazole (PRILOSEC) 40 MG capsule Take 1 capsule (40 mg total) by mouth daily. 02/15/15   Olivia Canter Nneka Blanda, PA-C   BP 117/85 mmHg  Pulse 107  Temp(Src) 97.9 F (36.6 C) (Oral)  Resp 18  SpO2 98% Physical Exam  Constitutional: He is oriented to person, place, and time.  HENT:  Right Ear: External ear normal.  Left Ear: External ear normal.  Nose: Nose normal.  Mouth/Throat: Oropharynx is clear and moist. Mucous membranes are not dry. No oropharyngeal exudate.  Eyes: Conjunctivae and EOM are normal. Pupils are equal, round, and reactive to light.  Neck: Normal range of motion. Neck supple.  Cardiovascular: Normal rate, regular rhythm, normal heart sounds and intact distal pulses.   Pulmonary/Chest: Effort normal and breath sounds normal. No respiratory distress. He has no wheezes. He exhibits no tenderness.  Abdominal: Soft. Bowel sounds are normal.  Reproducible pain in epigastric region. Mildly ttp but no guarding. No rebound tenderness. Belly is soft. Normal bowel sounds. Negative rovsing's mcburneys, and murphy's.   Musculoskeletal: Normal range of motion. He exhibits no edema.  Neurological: He is alert and oriented to person, place, and time. No cranial nerve deficit.  Skin: Skin is warm and  dry. No pallor.  Psychiatric: He has a normal mood and affect.  Nursing note and vitals reviewed.    Filed Vitals:   02/15/15 1900 02/15/15 2100  BP: 138/78 117/85  Pulse: 112 107  Temp: 97.9 F (36.6 C)   TempSrc: Oral   Resp: 18   SpO2: 96% 98%   ED Course  Procedures (including critical care time) Labs Review Labs Reviewed  COMPREHENSIVE METABOLIC PANEL - Abnormal; Notable for the following:    Glucose, Bld 132 (*)    AST <5 (*)    ALT 123 (*)    Total Bilirubin 2.5 (*)    All other components within normal limits  CBC - Abnormal;  Notable for the following:    WBC 13.8 (*)    All other components within normal limits  URINALYSIS, ROUTINE W REFLEX MICROSCOPIC (NOT AT [facility]) - Abnormal; Notable for the following:    Color, Urine ORANGE (*)    Bilirubin Urine SMALL (*)    Ketones, ur 15 (*)    Protein, ur 30 (*)    Leukocytes, UA SMALL (*)    All other components within normal limits  URINE MICROSCOPIC-ADD ON - Abnormal; Notable for the following:    Squamous Epithelial / LPF 0-5 (*)    Bacteria, UA RARE (*)    All other components within normal limits  LIPASE, BLOOD    Imaging Review No results found. I have personally reviewed and evaluated these images and lab results as part of my medical decision-making.   EKG Interpretation None         MDM   Final diagnoses:  Epigastric pain    Pt initially tachycardic to 107-112. 1L bolus, ofran, IV protonix, and GI cocktail given. Pt reports resolution of pain with these medications. HR down to 80s on re-eval. Workup otherwise consistent with earlier today. I have low suspicion for appendicitis, cholecystitis, pancreatitis. No evidence of bleeding. I suspect gastritis / GERD etiology of pt's pain. Pt VSS and comfortable now. He feels comfortable with calling for GI f/u after the weekend. Will give rx for omeprazole and short course of vicodin per pt's request. Strict return precautions given including fever,  intractable pain/n/v, etc. Pt verbalized agreeement.  Anne Ng, PA-C 02/17/15 1138  Orlie Dakin, MD 02/17/15 [phone]

## 2015-02-26 ENCOUNTER — Other Ambulatory Visit: Payer: Self-pay | Admitting: Physician Assistant

## 2015-02-26 DIAGNOSIS — R1011 Right upper quadrant pain: Secondary | ICD-10-CM

## 2015-03-05 ENCOUNTER — Ambulatory Visit
Admission: RE | Admit: 2015-03-05 | Discharge: 2015-03-05 | Disposition: A | Discharge status: Home / Self Care | Payer: 59 | Source: Ambulatory Visit | Attending: Physician Assistant | Admitting: Physician Assistant

## 2015-03-05 DIAGNOSIS — R1011 Right upper quadrant pain: Secondary | ICD-10-CM

## 2015-06-25 ENCOUNTER — Encounter: Payer: Self-pay | Admitting: Radiation Oncology

## 2015-06-25 NOTE — Progress Notes (Signed)
GU Location of Tumor / Histology: biochemical reoccurrence s/p prostatectomy done 04/28/11  If Prostate Cancer, Gleason Score is (3 + 4) and PSA is (4.8) pretreatment. Then, February 2017 was up to 0.08 from 0.05.  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 - EXTRACAPSULAR EXTENSION BY TUMOR. - MARGINS NOT INVOLVED. Microscopic Comment PROSTATE RADICAL RESECTION/PROSTATECTOMY Histologic type: Prostatic adenocarcinoma. Gleasons Score: 3 + 4 = 7. Tertiary score (if applicable): N/A. Involving (half lobe or less, one or both lobes): Both lobes. Estimated proportion (%) of prostate tissue involved by tumor: 10%. Extraprostatic extension: Yes, right lobe. Involvement of apex: No. Margins: Free of tumor. Seminal vesicles: Free of tumor. Lymph-Vascular invasion: No. Treatment effect: No. Lymph nodes: number examined 0; number positive N/A. TNM code: pT3a, pNX. Comment: There is extensive perineural invasion by tumor.  Past/Anticipated interventions by urology, if any: prostatectomy with follow up  Past/Anticipated interventions by medical oncology, if any: no  Weight changes, if any: no  Bowel/Bladder complaints, if any: Good stream steady without difficulty emptying. No incontinence or leakage. ED managed with Cialis. Denies dysuria, hematuria, incontinence or leakage.   Nausea/Vomiting, if any: no  Pain issues, if any:  no  SAFETY ISSUES:  Prior radiation? no  Pacemaker/ICD? no  Possible current pregnancy? no  Is the patient on methotrexate? no  Current Complaints / other details:  55 year old male. Married with one son and one daughter. NKDA.

## 2015-06-26 ENCOUNTER — Ambulatory Visit
Admission: RE | Admit: 2015-06-26 | Discharge: 2015-06-26 | Disposition: A | Discharge status: Home / Self Care | Payer: 59 | Source: Ambulatory Visit | Attending: Radiation Oncology | Admitting: Radiation Oncology

## 2015-06-26 VITALS — BP 126/79 | HR 76 | Resp 16 | Ht 71.0 in | Wt 227.9 lb

## 2015-06-26 DIAGNOSIS — C61 Malignant neoplasm of prostate: Secondary | ICD-10-CM | POA: Insufficient documentation

## 2015-06-26 DIAGNOSIS — Z86711 Personal history of pulmonary embolism: Secondary | ICD-10-CM | POA: Diagnosis not present

## 2015-06-26 DIAGNOSIS — I739 Peripheral vascular disease, unspecified: Secondary | ICD-10-CM | POA: Diagnosis not present

## 2015-06-26 DIAGNOSIS — J069 Acute upper respiratory infection, unspecified: Secondary | ICD-10-CM | POA: Diagnosis not present

## 2015-06-26 DIAGNOSIS — J45909 Unspecified asthma, uncomplicated: Secondary | ICD-10-CM | POA: Diagnosis not present

## 2015-06-26 DIAGNOSIS — I10 Essential (primary) hypertension: Secondary | ICD-10-CM | POA: Insufficient documentation

## 2015-06-26 DIAGNOSIS — Z86718 Personal history of other venous thrombosis and embolism: Secondary | ICD-10-CM | POA: Insufficient documentation

## 2015-06-26 DIAGNOSIS — Z510 Encounter for antineoplastic radiation therapy: Secondary | ICD-10-CM | POA: Insufficient documentation

## 2015-06-26 DIAGNOSIS — Z8546 Personal history of malignant neoplasm of prostate: Secondary | ICD-10-CM

## 2015-06-26 DIAGNOSIS — E7800 Pure hypercholesterolemia, unspecified: Secondary | ICD-10-CM | POA: Diagnosis not present

## 2015-06-26 NOTE — Progress Notes (Signed)
See progress note under physician encounter. 

## 2015-06-26 NOTE — Progress Notes (Signed)
Radiation Oncology         [phone] ________________________________  Initial Outpatient Consultation  Name: Eric Olsen MRN: [id number]  Date: 06/26/2015  DOB: Feb 05, 1961  CC:SUN,VYVYAN Y, MD  Rana Snare, MD   REFERRING PHYSICIAN: Rana Snare, MD  DIAGNOSIS: 55 y.o. 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    ICD-9-CM ICD-10-CM   1. Malignant neoplasm of prostate ([place]) 185 C61   2. History of prostate cancer V10.46 Z85.46     HISTORY OF PRESENT ILLNESS::Eric Olsen is a 55 y.o. gentleman with a history of prostate cancer in 2013. He was noted to have an elevated PSA of 4.8 in 2013 by his primary care physician.  Accordingly, he was referred for evaluation in urology by Dr. Risa Grill. The patient proceeded to transrectal ultrasound with 12 biopsies of the prostate on 02/05/2011.  Out of 12 core biopsies, 8 were positive.  The maximum Gleason score was 3 + 3. He had a prostatectomy in January 2013 and the pathology showed a Gleason score of 3 + 4 and he had extracapsular extension by tumor on the right side. Initially after the prostatectomy, his PSA was low at 0.01. It then increased in January 2016 to 0.05 and is now at 0.08 as of February 2017.  The patient reviewed the PSA results with his urologist and he has kindly been referred today for discussion of potential radiation treatment options.  PREVIOUS RADIATION THERAPY: No  PAST MEDICAL HISTORY:  has a past medical history of PONV (postoperative nausea and vomiting); Peripheral vascular disease ([place]); Prostate cancer ([place]); Recurrent upper respiratory infection (URI); Asthma; Hypertension; Dyslipidemia; History of DVT (deep vein thrombosis); and History of pulmonary embolus (PE).    PAST SURGICAL HISTORY: Past Surgical History  Procedure Laterality Date  . Tonsillectomy    .  Cardiac catheterization  03/16/2005    RCA is large & dominant, no lesions, EF 60% (Dr. Domenic Moras)  . Facial nerve decompression      right elbow- radial   . Robot assisted laparoscopic radical prostatectomy  04/28/2011    Procedure: ROBOTIC ASSISTED LAPAROSCOPIC RADICAL PROSTATECTOMY;  Surgeon: Bernestine Amass, MD;  Location: [facility];  Service: Urology;  Laterality: N/A;  . Transthoracic echocardiogram  2009    EF=>55%, borderline conc LVH, trace MR/TR  . Nm myocar perf wall motion  2006    persantine study - inferior wall thinning; EF 63%; low risk  . Prostate biopsy      FAMILY HISTORY: family history includes Heart attack in his mother.  SOCIAL HISTORY:  reports that he has never smoked. He has never used smokeless tobacco. He reports that he does not drink alcohol or use illicit drugs.  ALLERGIES: Review of patient's allergies indicates no known allergies.  MEDICATIONS:  Current Outpatient Prescriptions  Medication Sig Dispense Refill  . albuterol (PROVENTIL HFA;VENTOLIN HFA) 108 (90 BASE) MCG/ACT inhaler Inhale 2 puffs into the lungs every 6 (six) hours as needed. Wheezing     . Ascorbic Acid (VITAMIN C) 1000 MG tablet Take 1,000 mg by mouth daily.    Marland Kitchen aspirin 81 MG tablet Take 81 mg by mouth daily.    . Garlic 123XX123 MG CAPS Take 1 capsule by mouth daily.    Marland Kitchen lisinopril (PRINIVIL,ZESTRIL) 20 MG tablet Take 1 tablet (20 mg total) by mouth daily. 90 tablet 3  . Multiple  Vitamin (MULTIVITAMIN) capsule Take 1 capsule by mouth daily.    . Omega-3 Krill Oil 1000 MG CAPS Take 1 capsule by mouth daily.    Marland Kitchen omeprazole (PRILOSEC) 40 MG capsule Take 1 capsule (40 mg total) by mouth daily. 30 capsule 0  . rosuvastatin (CRESTOR) 20 MG tablet Take 0.5 tablets (10 mg total) by mouth daily. 30 tablet 6  . tadalafil (CIALIS) 5 MG tablet Take 5 mg by mouth daily as needed for erectile dysfunction.     No current facility-administered medications for this encounter.    REVIEW OF SYSTEMS:  A 15  point review of systems is documented in the electronic medical record. This was obtained by the nursing staff. However, I reviewed this with the patient to discuss relevant findings and make appropriate changes.  Pertinent items are noted in HPI..  The patient completed an IPSS and IIEF questionnaire.  His IPSS score was 2 indicating mild urinary outflow obstructive symptoms.  He indicated that his erectile function is able to complete sexual activity half the time. He mentions that he has a good stream with no incontinence. His erectile dysfunction is managed with Viagra. He denies weight changes, nausea, vomiting, or pain.   PHYSICAL EXAM: This patient is in no acute distress.  He is alert and oriented.   height is 5\' 11"  (1.803 m) and weight is 227 lb 14.4 oz (103.375 kg). His blood pressure is 126/79 and his pulse is 76. His respiration is 16 and oxygen saturation is 100%.   In general this is a well appearing caucasian male in no acute distress. He is alert and oriented x4 and appropriate throughout the examination. HEENT reveals that the patient is normocephalic, atraumatic. EOMs are intact. PERRLA. Skin is intact without any evidence of gross lesions. Cardiovascular exam reveals a regular rate and rhythm, no clicks rubs or murmurs are auscultated. Chest is clear to auscultation bilaterally. Lymphatic assessment is performed and does not reveal any adenopathy in the cervical, supraclavicular, axillary, or inguinal chains. Abdomen has active bowel sounds in all quadrants and is intact. The abdomen is soft, non tender, non distended. Lower extremities are negative for pretibial pitting edema, deep calf tenderness, cyanosis or clubbing.   KPS = 100  100 - Normal; no complaints; no evidence of disease. 90   - Able to carry on normal activity; minor signs or symptoms of disease. 80   - Normal activity with effort; some signs or symptoms of disease. 59   - Cares for self; unable to carry on normal  activity or to do active work. 60   - Requires occasional assistance, but is able to care for most of his personal needs. 50   - Requires considerable assistance and frequent medical care. 65   - Disabled; requires special care and assistance. 48   - Severely disabled; hospital admission is indicated although death not imminent. 76   - Very sick; hospital admission necessary; active supportive treatment necessary. 10   - Moribund; fatal processes progressing rapidly. 0     - Dead  Karnofsky DA, Abelmann [place], Craver LS and Burchenal [facility] [phone]) The use of the nitrogen mustards in the palliative treatment of carcinoma: with particular reference to bronchogenic carcinoma Cancer 1 634-56   LABORATORY DATA:  Lab Results  Component Value Date   WBC 13.8* 02/15/2015   HGB 15.0 02/15/2015   HCT 43.1 02/15/2015   MCV 94.9 02/15/2015   PLT 177 02/15/2015   Lab Results  Component Value  Date   NA 137 02/15/2015   K 4.1 02/15/2015   CL 103 02/15/2015   CO2 26 02/15/2015   Lab Results  Component Value Date   ALT 123* 02/15/2015   AST <5* 02/15/2015   ALKPHOS 105 02/15/2015   BILITOT 2.5* 02/15/2015     RADIOGRAPHY: No results found.  IMPRESSION: This gentleman is a 55 yo male with adenocarcinoma of the prostate status post prostatectomy.  His detectable rising PSA and extracapsular extension may warrant prostatic fossa radiotherapy.  Accordingly he is eligible for a variety of potential treatment options including IMRT.  PLAN: Today I reviewed the findings and workup thus far.  We discussed the natural history of prostate cancer.  We reviewed the the implications of T-stage, Gleason's Score, and PSA on decision-making and outcomes in prostate cancer.  We discussed radiation treatment in the management of prostate cancer with regard to the logistics and delivery of external beam radiation treatment. The patient expressed interest in external beam radiotherapy.  I filled out a patient counseling  form for him with relevant treatment diagrams and we retained a copy for our records.   The patient would like to proceed with prostatic fossa IMRT.  I will share my findings with Dr. Risa Grill and move forward with IMRT in the near future. His planning appointment is scheduled 07/04/2015 at 8 am.  We discussed the use of hormone therapy in light of recent supporting data. We discussed the possible side effects of hormone therapy. He is not interested in hormone therapy.  I enjoyed meeting with him today, and will look forward to participating in the care of this very nice gentleman.   I spent 40 minutes face to face with the patient and more than 50% of that time was spent in counseling and/or coordination of care.   ------------------------------------------------  Sheral Apley. Tammi Klippel, M.D.    This document serves as a record of services personally performed by Shona Simpson, PAC and  Tyler Pita, MD. It was created on their behalf by Lendon Collar, a trained medical scribe. The creation of this record is based on the scribe's personal observations and the provider's statements to them. This document has been checked and approved by the attending provider.

## 2015-07-04 ENCOUNTER — Ambulatory Visit
Admission: RE | Admit: 2015-07-04 | Discharge: 2015-07-04 | Disposition: A | Discharge status: Home / Self Care | Payer: 59 | Source: Ambulatory Visit | Attending: Radiation Oncology | Admitting: Radiation Oncology

## 2015-07-04 DIAGNOSIS — Z510 Encounter for antineoplastic radiation therapy: Secondary | ICD-10-CM | POA: Diagnosis not present

## 2015-07-04 DIAGNOSIS — C61 Malignant neoplasm of prostate: Secondary | ICD-10-CM

## 2015-07-04 NOTE — Progress Notes (Signed)
  Radiation Oncology         ([phone]) [phone] ________________________________  Name: Eric Olsen MRN: [id number]  Date: 07/04/2015  DOB: [birth year]  SIMULATION AND TREATMENT PLANNING NOTE    ICD-9-CM ICD-10-CM   1. Malignant neoplasm of prostate ([place]) 185 C61     DIAGNOSIS:  55 y.o. 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  NARRATIVE:  The patient was brought to the [facility] [place].  Identity was confirmed.  All relevant records and images related to the planned course of therapy were reviewed.  The patient freely provided informed written consent to proceed with treatment after reviewing the details related to the planned course of therapy. The consent form was witnessed and verified by the simulation staff.  Then, the patient was set-up in a stable reproducible supine position for radiation therapy.  A vacuum lock pillow device was custom fabricated to position his legs in a reproducible immobilized position.  Then, I performed a urethrogram under sterile conditions to identify the prostatic apex.  CT images were obtained.  Surface markings were placed.  The CT images were loaded into the planning software.  Then the prostate target and avoidance structures including the rectum, bladder, bowel and hips were contoured.  Treatment planning then occurred.  The radiation prescription was entered and confirmed.  A total of one complex treatment device was fabricated. I have requested : Intensity Modulated Radiotherapy (IMRT) is medically necessary for this case for the following reason:  Rectal sparing.Marland Kitchen  PLAN:  The patient will receive 68.4 Gy in 38 fractions.  ________________________________  Sheral Apley Tammi Klippel, M.D.  This document serves as a record of services personally performed by Tyler Pita, MD. It was created on his behalf by  Arlyce Harman, a trained medical scribe. The creation of this record is based on the scribe's personal observations and the provider's statements to them. This document has been checked and approved by the attending provider.

## 2015-07-05 ENCOUNTER — Other Ambulatory Visit: Payer: Self-pay | Admitting: Internal Medicine

## 2015-07-08 DIAGNOSIS — Z510 Encounter for antineoplastic radiation therapy: Secondary | ICD-10-CM | POA: Diagnosis not present

## 2015-07-14 ENCOUNTER — Ambulatory Visit: Payer: 59

## 2015-07-15 ENCOUNTER — Ambulatory Visit
Admission: RE | Admit: 2015-07-15 | Discharge: 2015-07-15 | Disposition: A | Discharge status: Home / Self Care | Payer: 59 | Source: Ambulatory Visit | Attending: Radiation Oncology | Admitting: Radiation Oncology

## 2015-07-15 DIAGNOSIS — Z510 Encounter for antineoplastic radiation therapy: Secondary | ICD-10-CM | POA: Diagnosis not present

## 2015-07-16 ENCOUNTER — Ambulatory Visit
Admission: RE | Admit: 2015-07-16 | Discharge: 2015-07-16 | Disposition: A | Discharge status: Home / Self Care | Payer: 59 | Source: Ambulatory Visit | Attending: Radiation Oncology | Admitting: Radiation Oncology

## 2015-07-16 DIAGNOSIS — Z510 Encounter for antineoplastic radiation therapy: Secondary | ICD-10-CM | POA: Diagnosis not present

## 2015-07-17 ENCOUNTER — Ambulatory Visit
Admission: RE | Admit: 2015-07-17 | Discharge: 2015-07-17 | Disposition: A | Discharge status: Home / Self Care | Payer: 59 | Source: Ambulatory Visit | Attending: Radiation Oncology | Admitting: Radiation Oncology

## 2015-07-17 DIAGNOSIS — Z510 Encounter for antineoplastic radiation therapy: Secondary | ICD-10-CM | POA: Diagnosis not present

## 2015-07-18 ENCOUNTER — Ambulatory Visit
Admission: RE | Admit: 2015-07-18 | Discharge: 2015-07-18 | Disposition: A | Discharge status: Home / Self Care | Payer: 59 | Source: Ambulatory Visit | Attending: Radiation Oncology | Admitting: Radiation Oncology

## 2015-07-18 ENCOUNTER — Encounter: Payer: Self-pay | Admitting: Radiation Oncology

## 2015-07-18 VITALS — BP 132/84 | HR 84 | Resp 16 | Wt 226.6 lb

## 2015-07-18 DIAGNOSIS — Z510 Encounter for antineoplastic radiation therapy: Secondary | ICD-10-CM | POA: Diagnosis not present

## 2015-07-18 DIAGNOSIS — C61 Malignant neoplasm of prostate: Secondary | ICD-10-CM

## 2015-07-18 NOTE — Progress Notes (Addendum)
Weight and vitals stable. Denies pain. Denies dysuria or hematuria. Denies leakage or incontinence. Denies urgency or frequency. Reports nocturia x 1. Denies diarrhea. Concerned he felt more fatigue yesterday but, does report he didn't sleep well the night before. Oriented patient to staff and routine of the clinic. Provided patient with RADIATION THERAPY AND YOU handbook then, reviewed pertinent information. Educated patient reference potential side effects and management such as fatigue, diarrhea and urinary/bladder changes. Provided patient with my business card and encouraged him to call with needs. Answered all patient questions to the best of my ability. Patient verbalized understanding of all reviewed.    BP 132/84 mmHg  Pulse 84  Resp 16  Wt 226 lb 9.6 oz (102.785 kg)  SpO2 100% Wt Readings from Last 3 Encounters:  07/18/15 226 lb 9.6 oz (102.785 kg)  06/25/15 227 lb 14.4 oz (103.375 kg)  06/26/14 225 lb 9.6 oz (102.331 kg)

## 2015-07-18 NOTE — Progress Notes (Signed)
  Radiation Oncology         [phone]   Name: Eric Olsen MRN: [id number]   Date: 07/18/2015  DOB: 1960/06/22     Weekly Radiation Therapy Management    ICD-9-CM ICD-10-CM   1. Malignant neoplasm of prostate ([facility]) 185 C61     Current Dose: 7.2 Gy  Planned Dose:  68.4 Gy  Narrative The patient presents for routine under treatment assessment.  Weight and vitals stable. Denies pain. Denies dysuria or hematuria. Denies leakage or incontinence. Denies urgency or frequency. Reports nocturia x 1. Denies diarrhea. Concerned he felt more fatigue yesterday but, does report he didn't sleep well the night before.     The patient is without complaint. Set-up films were reviewed. The chart was checked.  Physical Findings  weight is 226 lb 9.6 oz (102.785 kg). His blood pressure is 132/84 and his pulse is 84. His respiration is 16 and oxygen saturation is 100%. . Weight essentially stable.  No significant changes.  Impression The patient is tolerating radiation.  Plan Continue treatment as planned.         Sheral Apley Tammi Klippel, M.D.  This document serves as a record of services personally performed by Tyler Pita, MD. It was created on his behalf by Arlyce Harman, a trained medical scribe. The creation of this record is based on the scribe's personal observations and the provider's statements to them. This document has been checked and approved by the attending provider.

## 2015-07-18 NOTE — Addendum Note (Signed)
Encounter addended by: Heywood Footman, RN on: 07/18/2015 11:48 AM<BR>     Documentation filed: Notes Section, Medications, Flowsheet VN, Chief Complaint Section

## 2015-07-21 ENCOUNTER — Ambulatory Visit
Admission: RE | Admit: 2015-07-21 | Discharge: 2015-07-21 | Disposition: A | Discharge status: Home / Self Care | Payer: 59 | Source: Ambulatory Visit | Attending: Radiation Oncology | Admitting: Radiation Oncology

## 2015-07-21 DIAGNOSIS — Z510 Encounter for antineoplastic radiation therapy: Secondary | ICD-10-CM | POA: Diagnosis not present

## 2015-07-22 ENCOUNTER — Ambulatory Visit
Admission: RE | Admit: 2015-07-22 | Discharge: 2015-07-22 | Disposition: A | Discharge status: Home / Self Care | Payer: 59 | Source: Ambulatory Visit | Attending: Radiation Oncology | Admitting: Radiation Oncology

## 2015-07-22 DIAGNOSIS — Z510 Encounter for antineoplastic radiation therapy: Secondary | ICD-10-CM | POA: Diagnosis not present

## 2015-07-23 ENCOUNTER — Ambulatory Visit
Admission: RE | Admit: 2015-07-23 | Discharge: 2015-07-23 | Disposition: A | Discharge status: Home / Self Care | Payer: 59 | Source: Ambulatory Visit | Attending: Radiation Oncology | Admitting: Radiation Oncology

## 2015-07-23 DIAGNOSIS — Z510 Encounter for antineoplastic radiation therapy: Secondary | ICD-10-CM | POA: Diagnosis not present

## 2015-07-24 ENCOUNTER — Ambulatory Visit
Admission: RE | Admit: 2015-07-24 | Discharge: 2015-07-24 | Disposition: A | Discharge status: Home / Self Care | Payer: 59 | Source: Ambulatory Visit | Attending: Radiation Oncology | Admitting: Radiation Oncology

## 2015-07-24 ENCOUNTER — Telehealth: Payer: Self-pay | Admitting: Internal Medicine

## 2015-07-24 ENCOUNTER — Encounter: Payer: Self-pay | Admitting: Radiation Oncology

## 2015-07-24 VITALS — BP 119/87 | HR 91 | Resp 16 | Wt 226.9 lb

## 2015-07-24 DIAGNOSIS — C61 Malignant neoplasm of prostate: Secondary | ICD-10-CM

## 2015-07-24 DIAGNOSIS — E785 Hyperlipidemia, unspecified: Secondary | ICD-10-CM

## 2015-07-24 DIAGNOSIS — Z79899 Other long term (current) drug therapy: Secondary | ICD-10-CM

## 2015-07-24 DIAGNOSIS — Z510 Encounter for antineoplastic radiation therapy: Secondary | ICD-10-CM | POA: Diagnosis not present

## 2015-07-24 NOTE — Progress Notes (Signed)
Weight and vitals stable. Denies pain. Denies dysuria or hematuria. Denies leakage or incontinence. Denies urgency or frequency. Reports nocturia x 1. Denies diarrhea. Reports he is slightly more fatigued.   BP 119/87 mmHg  Pulse 91  Resp 16  Wt 226 lb 14.4 oz (102.921 kg)  SpO2 100% Wt Readings from Last 3 Encounters:  07/24/15 226 lb 14.4 oz (102.921 kg)  07/18/15 226 lb 9.6 oz (102.785 kg)  06/25/15 227 lb 14.4 oz (103.375 kg)

## 2015-07-24 NOTE — Telephone Encounter (Signed)
Spoke to patient  Patient states he has not had labs done this year for lipids Mailed lab slip --patient aware has to be fasting for tests

## 2015-07-24 NOTE — Telephone Encounter (Signed)
New Message  Pt sched 1 yr rROV - wanted to know if labs were needed- no order in syst- Please call back and discuss.

## 2015-07-24 NOTE — Progress Notes (Signed)
  Radiation Oncology         [phone]   Name: Eric Olsen MRN: [id number]   Date: 07/24/2015  DOB: [birth year]     Weekly Radiation Therapy Management    ICD-9-CM ICD-10-CM   1. Malignant neoplasm of prostate ([facility]) 185 C61     Current Dose: 14.4 Gy  Planned Dose:  68.4 Gy  Narrative The patient presents for routine under treatment assessment.  Weight and vitals stable. Denies pain. Denies dysuria or hematuria. Denies leakage or incontinence. Denies urgency or frequency. Reports nocturia x 1. Denies diarrhea. Reports he is slightly more fatigued. He mentions that he walks during work.   The patient is without complaint. Set-up films were reviewed. The chart was checked.  Physical Findings  weight is 226 lb 14.4 oz (102.921 kg). His blood pressure is 119/87 and his pulse is 91. His respiration is 16 and oxygen saturation is 100%. . Weight essentially stable.  No significant changes.  Impression The patient is tolerating radiation.  Plan Continue treatment as planned. We discussed light exercise helps with radiation fatigue.     Sheral Apley Tammi Klippel, M.D.    This document serves as a record of services personally performed by Tyler Pita, MD. It was created on his behalf by Lendon Collar, a trained medical scribe. The creation of this record is based on the scribe's personal observations and the provider's statements to them. This document has been checked and approved by the attending provider.

## 2015-07-25 ENCOUNTER — Ambulatory Visit
Admission: RE | Admit: 2015-07-25 | Discharge: 2015-07-25 | Disposition: A | Discharge status: Home / Self Care | Payer: 59 | Source: Ambulatory Visit | Attending: Radiation Oncology | Admitting: Radiation Oncology

## 2015-07-25 DIAGNOSIS — Z510 Encounter for antineoplastic radiation therapy: Secondary | ICD-10-CM | POA: Diagnosis not present

## 2015-07-28 ENCOUNTER — Ambulatory Visit
Admission: RE | Admit: 2015-07-28 | Discharge: 2015-07-28 | Disposition: A | Discharge status: Home / Self Care | Payer: 59 | Source: Ambulatory Visit | Attending: Radiation Oncology | Admitting: Radiation Oncology

## 2015-07-28 DIAGNOSIS — Z510 Encounter for antineoplastic radiation therapy: Secondary | ICD-10-CM | POA: Diagnosis not present

## 2015-07-29 ENCOUNTER — Ambulatory Visit
Admission: RE | Admit: 2015-07-29 | Discharge: 2015-07-29 | Disposition: A | Discharge status: Home / Self Care | Payer: 59 | Source: Ambulatory Visit | Attending: Radiation Oncology | Admitting: Radiation Oncology

## 2015-07-29 DIAGNOSIS — Z510 Encounter for antineoplastic radiation therapy: Secondary | ICD-10-CM | POA: Diagnosis not present

## 2015-07-30 ENCOUNTER — Ambulatory Visit: Admission: RE | Admit: 2015-07-30 | Payer: 59 | Source: Ambulatory Visit

## 2015-07-30 ENCOUNTER — Ambulatory Visit
Admission: RE | Admit: 2015-07-30 | Discharge: 2015-07-30 | Disposition: A | Discharge status: Home / Self Care | Payer: 59 | Source: Ambulatory Visit | Attending: Radiation Oncology | Admitting: Radiation Oncology

## 2015-07-31 ENCOUNTER — Ambulatory Visit: Admission: RE | Admit: 2015-07-31 | Payer: 59 | Source: Ambulatory Visit | Admitting: Radiation Oncology

## 2015-07-31 ENCOUNTER — Ambulatory Visit: Payer: 59

## 2015-07-31 ENCOUNTER — Ambulatory Visit
Admission: RE | Admit: 2015-07-31 | Discharge: 2015-07-31 | Disposition: A | Discharge status: Home / Self Care | Payer: 59 | Source: Ambulatory Visit | Attending: Radiation Oncology | Admitting: Radiation Oncology

## 2015-07-31 DIAGNOSIS — C61 Malignant neoplasm of prostate: Secondary | ICD-10-CM

## 2015-08-01 ENCOUNTER — Ambulatory Visit: Payer: 59 | Attending: Radiation Oncology | Admitting: Radiation Oncology

## 2015-08-01 ENCOUNTER — Ambulatory Visit
Admission: RE | Admit: 2015-08-01 | Discharge: 2015-08-01 | Disposition: A | Discharge status: Home / Self Care | Payer: 59 | Source: Ambulatory Visit | Attending: Radiation Oncology | Admitting: Radiation Oncology

## 2015-08-01 ENCOUNTER — Ambulatory Visit: Payer: 59

## 2015-08-01 DIAGNOSIS — Z510 Encounter for antineoplastic radiation therapy: Secondary | ICD-10-CM | POA: Diagnosis not present

## 2015-08-04 ENCOUNTER — Ambulatory Visit
Admission: RE | Admit: 2015-08-04 | Discharge: 2015-08-04 | Disposition: A | Discharge status: Home / Self Care | Payer: 59 | Source: Ambulatory Visit | Attending: Radiation Oncology | Admitting: Radiation Oncology

## 2015-08-04 ENCOUNTER — Telehealth: Payer: Self-pay | Admitting: Radiation Oncology

## 2015-08-04 ENCOUNTER — Encounter: Payer: Self-pay | Admitting: Radiation Oncology

## 2015-08-04 DIAGNOSIS — Z510 Encounter for antineoplastic radiation therapy: Secondary | ICD-10-CM | POA: Diagnosis not present

## 2015-08-04 NOTE — Telephone Encounter (Signed)
I called to let the patient know that after speaking with Dr. Tammi Klippel he did not feel that the patient needed to come today for a second treatment, and that biologically, there was no real benefit to being treated twice. That being said we discussed that a BID treatment today would be up to the discretion of the patient. He is interested in BID today, and we will just add the second "missed" treatment on to the end of the plan.

## 2015-08-04 NOTE — Progress Notes (Signed)
Weight and vitals stable. Denies pain. Reports nocturia x 1. Denies dysuria or hematuria. Denies incontinence or leakage. Denies diarrhea. Reports fatigue.   BP 120/81 mmHg  Pulse 83  Resp 16  Wt 226 lb 9.6 oz (102.785 kg)  SpO2 100% Wt Readings from Last 3 Encounters:  08/04/15 226 lb 9.6 oz (102.785 kg)  07/24/15 226 lb 14.4 oz (102.921 kg)  07/18/15 226 lb 9.6 oz (102.785 kg)

## 2015-08-04 NOTE — Progress Notes (Signed)
The patient was seen and is doing well overall. He missed his treatment on Wednesday and Thursday last week due to the machine being down. He'd like to try and make up for his treatment with a second treatment today. I will discuss with Dr. Tammi Klippel and let him know about recommendations.

## 2015-08-05 ENCOUNTER — Ambulatory Visit
Admission: RE | Admit: 2015-08-05 | Discharge: 2015-08-05 | Disposition: A | Discharge status: Home / Self Care | Payer: 59 | Source: Ambulatory Visit | Attending: Radiation Oncology | Admitting: Radiation Oncology

## 2015-08-05 DIAGNOSIS — Z510 Encounter for antineoplastic radiation therapy: Secondary | ICD-10-CM | POA: Diagnosis not present

## 2015-08-06 ENCOUNTER — Ambulatory Visit
Admission: RE | Admit: 2015-08-06 | Discharge: 2015-08-06 | Disposition: A | Discharge status: Home / Self Care | Payer: 59 | Source: Ambulatory Visit | Attending: Radiation Oncology | Admitting: Radiation Oncology

## 2015-08-06 DIAGNOSIS — Z510 Encounter for antineoplastic radiation therapy: Secondary | ICD-10-CM | POA: Diagnosis not present

## 2015-08-07 ENCOUNTER — Encounter: Payer: Self-pay | Admitting: Radiation Oncology

## 2015-08-07 ENCOUNTER — Ambulatory Visit
Admission: RE | Admit: 2015-08-07 | Discharge: 2015-08-07 | Disposition: A | Discharge status: Home / Self Care | Payer: 59 | Source: Ambulatory Visit | Attending: Radiation Oncology | Admitting: Radiation Oncology

## 2015-08-07 VITALS — BP 118/77 | HR 89 | Resp 16 | Wt 228.5 lb

## 2015-08-07 DIAGNOSIS — C61 Malignant neoplasm of prostate: Secondary | ICD-10-CM

## 2015-08-07 DIAGNOSIS — Z510 Encounter for antineoplastic radiation therapy: Secondary | ICD-10-CM | POA: Diagnosis not present

## 2015-08-07 LAB — COMPREHENSIVE METABOLIC PANEL
ALT: 13 U/L (ref 9–46)
AST: 21 U/L (ref 10–35)
Albumin: 4.1 g/dL (ref 3.6–5.1)
Alkaline Phosphatase: 52 U/L (ref 40–115)
BILIRUBIN TOTAL: 0.7 mg/dL (ref 0.2–1.2)
BUN: 11 mg/dL (ref 7–25)
CO2: 29 mmol/L (ref 20–31)
CREATININE: 0.98 mg/dL (ref 0.70–1.33)
Calcium: 9.2 mg/dL (ref 8.6–10.3)
Chloride: 102 mmol/L (ref 98–110)
GLUCOSE: 87 mg/dL (ref 65–99)
Potassium: 4.6 mmol/L (ref 3.5–5.3)
SODIUM: 139 mmol/L (ref 135–146)
Total Protein: 6.7 g/dL (ref 6.1–8.1)

## 2015-08-07 LAB — LIPID PANEL
Cholesterol: 94 mg/dL — ABNORMAL LOW (ref 125–200)
HDL: 34 mg/dL — ABNORMAL LOW (ref >=40)
LDL CALC: 34 mg/dL (ref <130)
Total CHOL/HDL Ratio: 2.8 Ratio (ref <=5.0)
Triglycerides: 128 mg/dL (ref <150)
VLDL: 26 mg/dL (ref <30)

## 2015-08-07 NOTE — Progress Notes (Signed)
  Radiation Oncology         [phone]   Name: Eric Olsen MRN: [id number]   Date: 08/07/2015  DOB: 02-02-1961     Weekly Radiation Therapy Management    ICD-9-CM ICD-10-CM   1. Malignant neoplasm of prostate ([facility]) 185 C61     Current Dose: 30.6 Gy  Planned Dose:  68.4 Gy  Narrative The patient presents for routine under treatment assessment.  Weight and vitals stable. Denies pain. Reports nocturia x 1. Denies dysuria or hematuria. Denies incontinence or leakage. Denies diarrhea. Reports rectal tenderness and mild burning with defecation. Reports intense fatigue.  Set-up films were reviewed. The chart was checked.  Physical Findings  weight is 228 lb 8 oz (103.647 kg). His blood pressure is 118/77 and his pulse is 89. His respiration is 16 and oxygen saturation is 100%.  Weight essentially stable. This is a pleasant male in no acute distress. He is alert and oriented. No skin changes noted in the rectal area.   Impression The patient is tolerating radiation.  Plan Continue treatment as planned. Bryson Ha suggested Dermaplast to the patient. I discussed other potential options to treat rectal burning if the issue persists.      Sheral Apley Tammi Klippel, M.D.  This document serves as a record of services personally performed by Shona Simpson, PA and Tyler Pita, MD. It was created on their behalf by Jenell Milliner, a trained medical scribe. The creation of this record is based on the scribe's personal observations and the provider's statements to them. This document has been checked and approved by the attending provider.

## 2015-08-07 NOTE — Progress Notes (Signed)
Weight and vitals stable. Denies pain. Reports nocturia x 1. Denies dysuria or hematuria. Denies incontinence or leakage. Denies diarrhea. Reports rectal tenderness and mild burning with defecation. Reports intense fatigue.   BP 118/77 mmHg  Pulse 89  Resp 16  Wt 228 lb 8 oz (103.647 kg)  SpO2 100% Wt Readings from Last 3 Encounters:  08/07/15 228 lb 8 oz (103.647 kg)  08/04/15 226 lb 9.6 oz (102.785 kg)  07/24/15 226 lb 14.4 oz (102.921 kg)

## 2015-08-08 ENCOUNTER — Ambulatory Visit
Admission: RE | Admit: 2015-08-08 | Discharge: 2015-08-08 | Disposition: A | Discharge status: Home / Self Care | Payer: 59 | Source: Ambulatory Visit | Attending: Radiation Oncology | Admitting: Radiation Oncology

## 2015-08-08 DIAGNOSIS — Z510 Encounter for antineoplastic radiation therapy: Secondary | ICD-10-CM | POA: Diagnosis not present

## 2015-08-11 ENCOUNTER — Ambulatory Visit
Admission: RE | Admit: 2015-08-11 | Discharge: 2015-08-11 | Disposition: A | Discharge status: Home / Self Care | Payer: 59 | Source: Ambulatory Visit | Attending: Radiation Oncology | Admitting: Radiation Oncology

## 2015-08-11 DIAGNOSIS — Z510 Encounter for antineoplastic radiation therapy: Secondary | ICD-10-CM | POA: Diagnosis not present

## 2015-08-12 ENCOUNTER — Ambulatory Visit (INDEPENDENT_AMBULATORY_CARE_PROVIDER_SITE_OTHER): Payer: 59 | Admitting: Internal Medicine

## 2015-08-12 ENCOUNTER — Ambulatory Visit
Admission: RE | Admit: 2015-08-12 | Discharge: 2015-08-12 | Disposition: A | Discharge status: Home / Self Care | Payer: 59 | Source: Ambulatory Visit | Attending: Radiation Oncology | Admitting: Radiation Oncology

## 2015-08-12 ENCOUNTER — Encounter: Payer: Self-pay | Admitting: Internal Medicine

## 2015-08-12 VITALS — BP 120/78 | HR 82 | Ht 71.0 in | Wt 229.0 lb

## 2015-08-12 DIAGNOSIS — C61 Malignant neoplasm of prostate: Secondary | ICD-10-CM | POA: Diagnosis not present

## 2015-08-12 DIAGNOSIS — I10 Essential (primary) hypertension: Secondary | ICD-10-CM | POA: Diagnosis not present

## 2015-08-12 DIAGNOSIS — E785 Hyperlipidemia, unspecified: Secondary | ICD-10-CM

## 2015-08-12 DIAGNOSIS — Z510 Encounter for antineoplastic radiation therapy: Secondary | ICD-10-CM | POA: Diagnosis not present

## 2015-08-12 MED ORDER — ROSUVASTATIN CALCIUM 20 MG PO TABS: 10.0000 mg | ORAL_TABLET | Freq: Every day | ORAL | Status: DC

## 2015-08-12 MED ORDER — LISINOPRIL 20 MG PO TABS: 20.0000 mg | ORAL_TABLET | Freq: Every day | ORAL | Status: DC

## 2015-08-12 NOTE — Patient Instructions (Signed)
Your physician wants you to follow-up in: 1 year with Dr. Hilty. You will receive a reminder letter in the mail two months in advance. If you don't receive a letter, please call our office to schedule the follow-up appointment.  

## 2015-08-12 NOTE — Progress Notes (Signed)
OFFICE NOTE  Chief Complaint:  Undergoing radiation therapy  Primary Care Physician: Lynne Logan, MD  HPI:  Eric Olsen  is a 55 year old gentleman who is a patient of yours who has a history of hypertension, dyslipidemia and prostate cancer which he has been cured of. He also had a history of DVT and PE in the past and was thought to be I guess due to hypercoagulable state. Overall he is doing very well. At last visit we were working on weight loss and he has managed to do that, down to 203 today from 212 pounds. Blood pressure remains well controlled at 128/84. He is exercising more often than he had been previously.   We did obtain a lipid profile on him at his last visit including particle numbers which showed an LDL particle number of 1578 despite the fact that his calculated LDL was 88 and his triglycerides were 130, total cholesterol 155 with an HDL of 41. While this appears to be a fairly normal lipid profile based on cholesterol content, the high particle number indicates excess risk. I did recommend starting Crestor 20 mg daily. He reports he's had some mild muscle pains with this and has decreased the dose to 10 mg every other day fairly recently. However he did have a recent lipid profile which shows a marked improvement. LDL particle numbers now decreased to 688, and LDL-C. is 50.  This is associated with a marked decrease in his risk for cardiovascular events.  I saw Eric Olsen back in the office today. He is doing well and denies any chest pain or shortness of breath. He is reporting some insomnia. This lead to some daytime fatigue and occasionally naps at lunch time. Unfortunately he's gained back about 20 pounds and needs to continue to work on weight loss. He says he started to do some walking at lunch and in the evenings. He had recent blood work performed in March 2016 which showed total cholesterol 102, triglycerides 108, HDL 35 and LDL 45. This is after decreasing his  Crestor down to 10 mg daily.  08/12/2015  Eric Olsen returns today for follow-up. Generally seems to be doing well. He does get some fatigue but that seems to be related to radiation therapy for his prostate. He recently had a recheck of his lipid profile which shows excellent cholesterol control. Total cholesterol 94, triglycerides 128, HDL 34 and LDL 34.  PMHx:  Past Medical History  Diagnosis Date  . PONV (postoperative nausea and vomiting)   . Peripheral vascular disease (HCC)     hx DVT 10 years ago following achilles tendon tear & PE-  last anticoagulation years ago  . Prostate cancer [facility])     prostate  . Recurrent upper respiratory infection (URI)     recent head cold- was on augumentin- states no fever- states resolved  . Asthma     states well controlled  . Hypertension     cardio Dr Debara Pickett-  LOV note 2/12 on chart  . Dyslipidemia   . History of DVT (deep vein thrombosis)     following achilles tendon surgery  . History of pulmonary embolus (PE)     following achilles tendon surgery     Past Surgical History  Procedure Laterality Date  . Tonsillectomy    . Cardiac catheterization  03/16/2005    RCA is large & dominant, no lesions, EF 60% (Dr. Domenic Moras)  . Facial nerve decompression      right  elbow- radial   . Robot assisted laparoscopic radical prostatectomy  04/28/2011    Procedure: ROBOTIC ASSISTED LAPAROSCOPIC RADICAL PROSTATECTOMY;  Surgeon: Bernestine Amass, MD;  Location: [facility];  Service: Urology;  Laterality: N/A;  . Transthoracic echocardiogram  2009    EF=>55%, borderline conc LVH, trace MR/TR  . Nm myocar perf wall motion  2006    persantine study - inferior wall thinning; EF 63%; low risk  . Prostate biopsy      FAMHx:  Family History  Problem Relation Age of Onset  . Heart attack Mother     SOCHx:   reports that he has never smoked. He has never used smokeless tobacco. He reports that he does not drink alcohol or use illicit drugs.  ALLERGIES:    No Known Allergies  ROS: A comprehensive review of systems was negative.  HOME MEDS: Current Outpatient Prescriptions  Medication Sig Dispense Refill  . albuterol (PROVENTIL HFA;VENTOLIN HFA) 108 (90 BASE) MCG/ACT inhaler Inhale 2 puffs into the lungs every 6 (six) hours as needed. Wheezing     . Ascorbic Acid (VITAMIN C) 1000 MG tablet Take 1,000 mg by mouth daily.    Marland Kitchen aspirin 81 MG tablet Take 81 mg by mouth daily.    . Garlic [id number] MG CAPS Take 1 capsule by mouth daily.    Marland Kitchen lisinopril (PRINIVIL,ZESTRIL) 20 MG tablet Take 1 tablet (20 mg total) by mouth daily. 90 tablet 3  . Multiple Vitamin (MULTIVITAMIN) capsule Take 1 capsule by mouth daily.    . Omega-3 Krill Oil 1000 MG CAPS Take 1 capsule by mouth daily.    Marland Kitchen omeprazole (PRILOSEC) 40 MG capsule Take 1 capsule (40 mg total) by mouth daily. 30 capsule 0  . rosuvastatin (CRESTOR) 20 MG tablet Take 0.5 tablets (10 mg total) by mouth daily. 45 tablet 3   No current facility-administered medications for this visit.    LABS/IMAGING: No results found for this or any previous visit (from the past 48 hour(s)). No results found.  VITALS: BP 120/78 mmHg  Pulse 82  Ht 5\' 11"  (1.803 m)  Wt 229 lb (103.874 kg)  BMI 31.95 kg/m2  EXAM: General appearance: alert and no distress Neck: no carotid bruit and no JVD Lungs: clear to auscultation bilaterally Heart: regular rate and rhythm, S1, S2 normal, no murmur, click, rub or gallop Abdomen: soft, non-tender; bowel sounds normal; no masses,  no organomegaly Extremities: extremities normal, atraumatic, no cyanosis or edema Pulses: 2+ and symmetric Skin: Skin color, texture, turgor normal. No rashes or lesions Neurologic: Grossly normal Psych: Mood, affect normal  EKG: Normal sinus rhythm at 82  ASSESSMENT: 1. Hypertension-controlled 2. Dyslipidemia at goal  3. History prostate cancer with recurrence-now undergoing XRT  PLAN: 1.   Eric Olsen has good control of his blood  pressure. Cholesterol is well controlled. He seems to be tolerating Crestor without any side effects therefore will continue his current dose of medication. He is undergoing XRT unfortunately for recurrent prostate cancer and I wish him the best with this. We'll plan to see him back annually or sooner as necessary.  Pixie Casino, MD, [facility] Attending Cardiologist [place] 08/12/2015, 6:44 PM

## 2015-08-13 ENCOUNTER — Encounter (HOSPITAL_BASED_OUTPATIENT_CLINIC_OR_DEPARTMENT_OTHER): Payer: Self-pay | Admitting: Emergency Medicine

## 2015-08-13 ENCOUNTER — Ambulatory Visit
Admission: RE | Admit: 2015-08-13 | Discharge: 2015-08-13 | Disposition: A | Discharge status: Home / Self Care | Payer: 59 | Source: Ambulatory Visit | Attending: Radiation Oncology | Admitting: Radiation Oncology

## 2015-08-13 ENCOUNTER — Emergency Department (HOSPITAL_BASED_OUTPATIENT_CLINIC_OR_DEPARTMENT_OTHER)
Admission: EM | Admit: 2015-08-13 | Discharge: 2015-08-14 | Disposition: A | Discharge status: Home / Self Care | Payer: 59 | Attending: Emergency Medicine | Admitting: Emergency Medicine

## 2015-08-13 DIAGNOSIS — IMO0001 Reserved for inherently not codable concepts without codable children: Secondary | ICD-10-CM

## 2015-08-13 DIAGNOSIS — C61 Malignant neoplasm of prostate: Secondary | ICD-10-CM | POA: Insufficient documentation

## 2015-08-13 DIAGNOSIS — Z510 Encounter for antineoplastic radiation therapy: Secondary | ICD-10-CM | POA: Diagnosis not present

## 2015-08-13 DIAGNOSIS — I739 Peripheral vascular disease, unspecified: Secondary | ICD-10-CM | POA: Diagnosis not present

## 2015-08-13 DIAGNOSIS — R1013 Epigastric pain: Secondary | ICD-10-CM | POA: Diagnosis present

## 2015-08-13 DIAGNOSIS — Z79899 Other long term (current) drug therapy: Secondary | ICD-10-CM | POA: Insufficient documentation

## 2015-08-13 DIAGNOSIS — R112 Nausea with vomiting, unspecified: Secondary | ICD-10-CM

## 2015-08-13 DIAGNOSIS — E785 Hyperlipidemia, unspecified: Secondary | ICD-10-CM | POA: Diagnosis not present

## 2015-08-13 DIAGNOSIS — R197 Diarrhea, unspecified: Secondary | ICD-10-CM | POA: Diagnosis not present

## 2015-08-13 DIAGNOSIS — I10 Essential (primary) hypertension: Secondary | ICD-10-CM | POA: Insufficient documentation

## 2015-08-13 DIAGNOSIS — J45909 Unspecified asthma, uncomplicated: Secondary | ICD-10-CM | POA: Insufficient documentation

## 2015-08-13 LAB — COMPREHENSIVE METABOLIC PANEL
ALT: 15 U/L — AB (ref 17–63)
AST: 24 U/L (ref 15–41)
Albumin: 4.3 g/dL (ref 3.5–5.0)
Alkaline Phosphatase: 47 U/L (ref 38–126)
Anion gap: 8 (ref 5–15)
BILIRUBIN TOTAL: 0.5 mg/dL (ref 0.3–1.2)
BUN: 16 mg/dL (ref 6–20)
CALCIUM: 9.2 mg/dL (ref 8.9–10.3)
CHLORIDE: 101 mmol/L (ref 101–111)
CO2: 30 mmol/L (ref 22–32)
CREATININE: 0.98 mg/dL (ref 0.61–1.24)
Glucose, Bld: 103 mg/dL — ABNORMAL HIGH (ref 65–99)
Potassium: 3.7 mmol/L (ref 3.5–5.1)
SODIUM: 139 mmol/L (ref 135–145)
TOTAL PROTEIN: 7.4 g/dL (ref 6.5–8.1)

## 2015-08-13 LAB — LIPASE, BLOOD: LIPASE: 24 U/L (ref 11–51)

## 2015-08-13 LAB — CBC WITH DIFFERENTIAL/PLATELET
BASOS ABS: 0 10*3/uL (ref 0.0–0.1)
BASOS PCT: 0 %
EOS ABS: 0.3 10*3/uL (ref 0.0–0.7)
EOS PCT: 4 %
HCT: 43 % (ref 39.0–52.0)
Hemoglobin: 14.9 g/dL (ref 13.0–17.0)
LYMPHS PCT: 18 %
Lymphs Abs: 1.5 10*3/uL (ref 0.7–4.0)
MCH: 32.5 pg (ref 26.0–34.0)
MCHC: 34.7 g/dL (ref 30.0–36.0)
MCV: 93.7 fL (ref 78.0–100.0)
MONO ABS: 1 10*3/uL (ref 0.1–1.0)
Monocytes Relative: 12 %
Neutro Abs: 5.5 10*3/uL (ref 1.7–7.7)
Neutrophils Relative %: 66 %
PLATELETS: 141 10*3/uL — AB (ref 150–400)
RBC: 4.59 MIL/uL (ref 4.22–5.81)
RDW: 12.9 % (ref 11.5–15.5)
WBC: 8.3 10*3/uL (ref 4.0–10.5)

## 2015-08-13 LAB — URINALYSIS, ROUTINE W REFLEX MICROSCOPIC
GLUCOSE, UA: NEGATIVE mg/dL
HGB URINE DIPSTICK: NEGATIVE
Ketones, ur: 15 mg/dL — AB
LEUKOCYTES UA: NEGATIVE
Nitrite: NEGATIVE
PH: 5.5 (ref 5.0–8.0)
Protein, ur: NEGATIVE mg/dL
SPECIFIC GRAVITY, URINE: 1.026 (ref 1.005–1.030)

## 2015-08-13 MED ORDER — ONDANSETRON 4 MG PO TBDP
4.0000 mg | ORAL_TABLET | Freq: Once | ORAL | Status: AC | PRN
Start: 2015-08-13 — End: 2015-08-13
  Administered 2015-08-13: 4 mg via ORAL
  Filled 2015-08-13: qty 1

## 2015-08-13 MED ORDER — HYDROMORPHONE HCL 1 MG/ML IJ SOLN
0.5000 mg | Freq: Once | INTRAMUSCULAR | Status: AC
  Administered 2015-08-13: 0.5 mg via INTRAVENOUS
  Filled 2015-08-13: qty 1

## 2015-08-13 NOTE — ED Notes (Signed)
abd pain w n/v/d onset this am  Worse this pm

## 2015-08-13 NOTE — ED Notes (Signed)
Pt in c/o upper abd pain and emesis onset today. Actively vomiting in triage.

## 2015-08-14 ENCOUNTER — Emergency Department (HOSPITAL_BASED_OUTPATIENT_CLINIC_OR_DEPARTMENT_OTHER): Payer: 59

## 2015-08-14 ENCOUNTER — Ambulatory Visit
Admission: RE | Admit: 2015-08-14 | Discharge: 2015-08-14 | Disposition: A | Discharge status: Home / Self Care | Payer: 59 | Source: Ambulatory Visit | Attending: Radiation Oncology | Admitting: Radiation Oncology

## 2015-08-14 ENCOUNTER — Encounter: Payer: Self-pay | Admitting: Radiation Oncology

## 2015-08-14 ENCOUNTER — Other Ambulatory Visit: Payer: Self-pay | Admitting: Gastroenterology

## 2015-08-14 VITALS — BP 123/80 | HR 92 | Temp 98.1°F | Resp 16 | Wt 225.8 lb

## 2015-08-14 DIAGNOSIS — C61 Malignant neoplasm of prostate: Secondary | ICD-10-CM

## 2015-08-14 DIAGNOSIS — Z510 Encounter for antineoplastic radiation therapy: Secondary | ICD-10-CM | POA: Diagnosis not present

## 2015-08-14 DIAGNOSIS — R1013 Epigastric pain: Secondary | ICD-10-CM

## 2015-08-14 MED ORDER — PANTOPRAZOLE SODIUM 20 MG PO TBEC: 20.0000 mg | DELAYED_RELEASE_TABLET | Freq: Every day | ORAL | Status: DC

## 2015-08-14 MED ORDER — DICYCLOMINE HCL 10 MG/ML IM SOLN
20.0000 mg | Freq: Once | INTRAMUSCULAR | Status: AC
  Administered 2015-08-14: 20 mg via INTRAMUSCULAR
  Filled 2015-08-14: qty 2

## 2015-08-14 MED ORDER — HYDROMORPHONE HCL 1 MG/ML IJ SOLN
0.5000 mg | Freq: Once | INTRAMUSCULAR | Status: AC
  Administered 2015-08-14: 0.5 mg via INTRAVENOUS
  Filled 2015-08-14: qty 1

## 2015-08-14 MED ORDER — SUCRALFATE 1 GM/10ML PO SUSP: 1.0000 g | Freq: Three times a day (TID) | ORAL | Status: DC

## 2015-08-14 MED ORDER — ONDANSETRON 4 MG PO TBDP: 4.0000 mg | ORAL_TABLET | Freq: Three times a day (TID) | ORAL | Status: DC | PRN

## 2015-08-14 MED ORDER — PANTOPRAZOLE SODIUM 40 MG IV SOLR
40.0000 mg | Freq: Once | INTRAVENOUS | Status: AC
  Administered 2015-08-14: 40 mg via INTRAVENOUS
  Filled 2015-08-14: qty 40

## 2015-08-14 MED ORDER — GI COCKTAIL ~~LOC~~
30.0000 mL | Freq: Once | ORAL | Status: AC
  Administered 2015-08-14: 30 mL via ORAL
  Filled 2015-08-14: qty 30

## 2015-08-14 MED ORDER — DICYCLOMINE HCL 20 MG PO TABS: 20.0000 mg | ORAL_TABLET | Freq: Two times a day (BID) | ORAL | Status: DC

## 2015-08-14 NOTE — Progress Notes (Signed)
  Radiation Oncology         [phone]   Name: Eric Olsen MRN: [id number]   Date: 08/14/2015  DOB: 1960-09-30     Weekly Radiation Therapy Management    ICD-9-CM ICD-10-CM   1. Malignant neoplasm of prostate (HCC) 185 C61     Current Dose: 39.6 Gy  Planned Dose:  68.4 Gy  Narrative The patient presents for routine under treatment assessment.  Weight and vitals stable. Denies pain. Reports nocturia x 1. Denies dysuria or hematuria. Denies incontinence or leakage. Reports diarrhea yesterday. However, patient spent the evening in the ED with what he presumes was a gallbladder attack. He had the same pain in November and had an ultrasound, showing no gallstones. Reports intense fatigue.  Set-up films were reviewed. The chart was checked.  Physical Findings  weight is 225 lb 12.8 oz (102.422 kg). His oral temperature is 98.1 F (36.7 C). His blood pressure is 123/80 and his pulse is 92. His respiration is 16 and oxygen saturation is 100%.  Weight essentially stable. This is a pleasant male in no acute distress. He is alert and oriented.  Impression The patient is tolerating radiation.  Plan Continue treatment as planned.     Sheral Apley Tammi Klippel, M.D.    This document serves as a record of services personally performed by Tyler Pita, MD. It was created on his behalf by Lendon Collar, a trained medical scribe. The creation of this record is based on the scribe's personal observations and the provider's statements to them. This document has been checked and approved by the attending provider.

## 2015-08-14 NOTE — Progress Notes (Signed)
Weight and vitals stable. Denies pain. Reports nocturia x 1. Denies dysuria or hematuria. Denies incontinence or leakage. Reports diarrhea yesterday. However, patient spent the evening in the ED with what he presumes was a gallbladder attack. Reports intense fatigue.   BP 123/80 mmHg  Pulse 92  Temp(Src) 98.1 F (36.7 C) (Oral)  Resp 16  Wt 225 lb 12.8 oz (102.422 kg)  SpO2 100% Wt Readings from Last 3 Encounters:  08/14/15 225 lb 12.8 oz (102.422 kg)  08/13/15 220 lb (99.791 kg)  08/12/15 229 lb (103.874 kg)

## 2015-08-14 NOTE — Discharge Instructions (Signed)
Please read and follow all provided instructions.  Your diagnoses today include:  1. Nausea vomiting and diarrhea    Tests performed today include:  Blood counts and electrolytes  Blood tests to check liver and kidney function  Blood tests to check pancreas function  Urine test to look for infection  Vital signs. See below for your results today.   Medications prescribed:   Zofran (ondansetron) - for nausea and vomiting  Take any prescribed medications only as directed.  Home care instructions:   Follow any educational materials contained in this packet.   Your abdominal pain, nausea, vomiting, and diarrhea may be caused by a viral gastroenteritis also called 'stomach flu'. You should rest for the next several days. Keep drinking plenty of fluids and use the medicine for nausea as directed.    Drink clear liquids for the next 24 hours and introduce solid foods slowly after 24 hours using the b.r.a.t. diet (Bananas, Rice, Applesauce, Toast, Yogurt).    Follow-up instructions: Please follow-up with your primary care provider in the next 2 days for further evaluation of your symptoms. If you are not feeling better in 48 hours you may have a condition that is more serious and you need re-evaluation.   Return instructions:  SEEK IMMEDIATE MEDICAL ATTENTION IF:  If you have pain that does not go away or becomes severe   A temperature above 101F develops   Repeated vomiting occurs (multiple episodes)   If you have pain that becomes localized to portions of the abdomen. The right side could possibly be appendicitis. In an adult, the left lower portion of the abdomen could be colitis or diverticulitis.   Blood is being passed in stools or vomit (bright red or black tarry stools)   You develop chest pain, difficulty breathing, dizziness or fainting, or become confused, poorly responsive, or inconsolable (young children)  If you have any other emergent concerns regarding your  health  Additional Information: Abdominal (belly) pain can be caused by many things. Your caregiver performed an examination and possibly ordered blood/urine tests and imaging (CT scan, x-rays, ultrasound). Many cases can be observed and treated at home after initial evaluation in the emergency department. Even though you are being discharged home, abdominal pain can be unpredictable. Therefore, you need a repeated exam if your pain does not resolve, returns, or worsens. Most patients with abdominal pain don't have to be admitted to the hospital or have surgery, but serious problems like appendicitis and gallbladder attacks can start out as nonspecific pain. Many abdominal conditions cannot be diagnosed in one visit, so follow-up evaluations are very important.  Your vital signs today were: BP 139/89 mmHg   Pulse 90   Temp(Src) 97.9 F (36.6 C) (Oral)   Resp 18   Ht 5\' 11"  (1.803 m)   Wt 99.791 kg   BMI 30.70 kg/m2   SpO2 98% If your blood pressure (bp) was elevated above 135/85 this visit, please have this repeated by your doctor within one month. --------------

## 2015-08-14 NOTE — ED Provider Notes (Signed)
CSN: [id number]     Arrival date & time 08/13/15  2153 History   First MD Initiated Contact with Patient 08/13/15 2213     Chief Complaint  Patient presents with  . Abdominal Pain  . Emesis     (Consider location/radiation/quality/duration/timing/severity/associated sxs/prior Treatment) HPI Comments: Patient with history of prostate cancer, TURP, currently undergoing radiation therapy -- presents with acute onset of vomiting and diarrhea today with a burning sensation in the epigastrium. No fevers. No blood in vomit or stool. Patient had 5 or 6 episodes of diarrhea that was watery. No lower abdominal pain or urinary symptoms. No treatments prior to arrival. Patient had similar symptoms last year which was improved with a GI cocktail. Patient has been tolerating his radiation therapy sessions well. Patient saw his cardiologist yesterday. No chest pain, shortness of breath, palpitations, diaphoresis. No other history of abdominal surgeries. No known sick contacts. No heavy NSAID or alcohol use. No suspicious food or water exposures. No recent antibiotic use. Course is constant. Nothing makes symptoms better or worse.  Patient is a 55 y.o. male presenting with abdominal pain and vomiting. The history is provided by the patient.  Abdominal Pain Associated symptoms: diarrhea, nausea and vomiting   Associated symptoms: no chest pain, no cough, no dysuria, no fever and no sore throat   Emesis Associated symptoms: abdominal pain and diarrhea   Associated symptoms: no myalgias and no sore throat     Past Medical History  Diagnosis Date  . PONV (postoperative nausea and vomiting)   . Peripheral vascular disease (HCC)     hx DVT 10 years ago following achilles tendon tear & PE-  last anticoagulation years ago  . Prostate cancer [facility])     prostate  . Recurrent upper respiratory infection (URI)     recent head cold- was on augumentin- states no fever- states resolved  . Asthma     states well  controlled  . Hypertension     cardio Dr Debara Pickett-  LOV note 2/12 on chart  . Dyslipidemia   . History of DVT (deep vein thrombosis)     following achilles tendon surgery  . History of pulmonary embolus (PE)     following achilles tendon surgery    Past Surgical History  Procedure Laterality Date  . Tonsillectomy    . Cardiac catheterization  03/16/2005    RCA is large & dominant, no lesions, EF 60% (Dr. Domenic Moras)  . Facial nerve decompression      right elbow- radial   . Robot assisted laparoscopic radical prostatectomy  04/28/2011    Procedure: ROBOTIC ASSISTED LAPAROSCOPIC RADICAL PROSTATECTOMY;  Surgeon: Bernestine Amass, MD;  Location: [facility];  Service: Urology;  Laterality: N/A;  . Transthoracic echocardiogram  2009    EF=>55%, borderline conc LVH, trace MR/TR  . Nm myocar perf wall motion  2006    persantine study - inferior wall thinning; EF 63%; low risk  . Prostate biopsy     Family History  Problem Relation Age of Onset  . Heart attack Mother    Social History  Substance Use Topics  . Smoking status: Never Smoker   . Smokeless tobacco: Never Used  . Alcohol Use: No    Review of Systems  Constitutional: Negative for fever and appetite change.  HENT: Negative for rhinorrhea and sore throat.   Eyes: Negative for redness.  Respiratory: Negative for cough.   Cardiovascular: Negative for chest pain.  Gastrointestinal: Positive for nausea, vomiting, abdominal pain  and diarrhea. Negative for blood in stool.       Negative for hematemesis  Genitourinary: Negative for dysuria.  Musculoskeletal: Negative for myalgias.  Skin: Negative for rash.  Neurological: Negative for light-headedness.      Allergies  Review of patient's allergies indicates no known allergies.  Home Medications   Prior to Admission medications   Medication Sig Start Date End Date Taking? Authorizing Provider  albuterol (PROVENTIL HFA;VENTOLIN HFA) 108 (90 BASE) MCG/ACT inhaler Inhale 2 puffs  into the lungs every 6 (six) hours as needed. Wheezing     Historical Provider, MD  Ascorbic Acid (VITAMIN C) 1000 MG tablet Take 1,000 mg by mouth daily.    Historical Provider, MD  aspirin 81 MG tablet Take 81 mg by mouth daily.    Historical Provider, MD  Garlic 123XX123 MG CAPS Take 1 capsule by mouth daily.    Historical Provider, MD  lisinopril (PRINIVIL,ZESTRIL) 20 MG tablet Take 1 tablet (20 mg total) by mouth daily. 08/12/15   Pixie Casino, MD  Multiple Vitamin (MULTIVITAMIN) capsule Take 1 capsule by mouth daily.    Historical Provider, MD  Omega-3 Krill Oil 1000 MG CAPS Take 1 capsule by mouth daily.    Historical Provider, MD  omeprazole (PRILOSEC) 40 MG capsule Take 1 capsule (40 mg total) by mouth daily. 02/15/15   Olivia Canter Sam, PA-C  rosuvastatin (CRESTOR) 20 MG tablet Take 0.5 tablets (10 mg total) by mouth daily. 08/12/15   Pixie Casino, MD   BP 156/108 mmHg  Pulse 78  Temp(Src) 97.9 F (36.6 C) (Oral)  Resp 18  Ht 5\' 11"  (1.803 m)  Wt 99.791 kg  BMI 30.70 kg/m2  SpO2 97%   Physical Exam  Constitutional: He appears well-developed and well-nourished.  HENT:  Head: Normocephalic and atraumatic.  Mouth/Throat: Oropharynx is clear and moist.  Eyes: Conjunctivae are normal. Right eye exhibits no discharge. Left eye exhibits no discharge.  Neck: Normal range of motion. Neck supple.  Cardiovascular: Normal rate, regular rhythm and normal heart sounds.   No murmur heard. Pulses:      Radial pulses are 2+ on the right side, and 2+ on the left side.  Pulmonary/Chest: Effort normal and breath sounds normal. No respiratory distress. He has no wheezes. He has no rales.  Abdominal: Soft. Bowel sounds are normal. He exhibits no distension. There is tenderness (Mild epigastric tenderness, no rigidity rebound or guarding.). There is no rebound.  Neurological: He is alert.  Skin: Skin is warm and dry.  Psychiatric: He has a normal mood and affect.  Nursing note and vitals  reviewed.   ED Course  Procedures (including critical care time) Labs Review Labs Reviewed  CBC WITH DIFFERENTIAL/PLATELET - Abnormal; Notable for the following:    Platelets 141 (*)    All other components within normal limits  COMPREHENSIVE METABOLIC PANEL - Abnormal; Notable for the following:    Glucose, Bld 103 (*)    ALT 15 (*)    All other components within normal limits  URINALYSIS, ROUTINE W REFLEX MICROSCOPIC (NOT AT [facility]) - Abnormal; Notable for the following:    Bilirubin Urine SMALL (*)    Ketones, ur 15 (*)    All other components within normal limits  LIPASE, BLOOD      EKG Interpretation   Date/Time:  Wednesday Aug 13 2015 22:02:59 EDT Ventricular Rate:  65 PR Interval:  184 QRS Duration: 92 QT Interval:  398 QTC Calculation: 413 R Axis:  54 Text Interpretation:  Normal sinus rhythm Cannot rule out Anterior infarct  , age undetermined Abnormal ECG Confirmed by DELO  MD, DOUGLAS ([id number]) on  08/13/2015 10:11:22 PM      Patient seen and examined. Patient was actively vomiting however symptoms are now improved with Zofran. Pending workup. EKG reviewed and is unchanged since yesterday. Low suspicion for ACS.  Vital signs reviewed and are as follows: BP 156/108 mmHg  Pulse 78  Temp(Src) 97.9 F (36.6 C) (Oral)  Resp 18  Ht 5\' 11"  (1.803 m)  Wt 99.791 kg  BMI 30.70 kg/m2  SpO2 97%   Lab work is reassuring. Patient updated. He is drinking in room without vomiting. Pain is better controlled now. He was requesting GI cocktail. Will give and reassess.  12:40 AM Symptoms controlled. Will d/c with zofran. Counseled on typical course of gastroenteritis and if patient is not feeling better he will need PCP follow-up.  The patient was urged to return to the Emergency Department immediately with worsening of current symptoms, worsening abdominal pain, persistent vomiting, blood noted in stools, fever, or any other concerns. The patient verbalized understanding.    12:50 AM Pain returning. Nausea controlled. Additional medications ordered. Discussed with Dr. Randal Buba who will see.   MDM   Final diagnoses:  Nausea vomiting and diarrhea   Abrupt onset of vomiting and diarrhea is consistent with gastroenteritis. Radiation-induced colitis is also a possibility. As this is a patient undergoing active treatment for cancer, labs ordered. White blood cell count is normal. No anemia. Electrolytes are normal. Normal kidney and liver function. Lipase is normal. Urine is not indicative of infection. Patient with some mild tenderness in the epigastrium however reassuring abdominal exam. Pain is not out of proportion exam as would be expected for mesenteric ischemia. Doubt ischemic colitis. Do not feel that imaging is indicated at this time symptoms do not seem to indicate biliary colic or significant intra-abdominal infection. Patient is much improved in emergency department with symptomatic treatment. EKG is unchanged from yesterday and symptoms are not typical for ACS. Will treat symptomatically and have patient follow-up with PCP. Return instructions as above.   Carlisle Cater, PA-C 08/14/15 [id number]  Veryl Speak, MD 08/19/15 [phone]

## 2015-08-15 ENCOUNTER — Ambulatory Visit
Admission: RE | Admit: 2015-08-15 | Discharge: 2015-08-15 | Disposition: A | Discharge status: Home / Self Care | Payer: 59 | Source: Ambulatory Visit | Attending: Radiation Oncology | Admitting: Radiation Oncology

## 2015-08-15 DIAGNOSIS — Z510 Encounter for antineoplastic radiation therapy: Secondary | ICD-10-CM | POA: Diagnosis not present

## 2015-08-18 ENCOUNTER — Ambulatory Visit
Admission: RE | Admit: 2015-08-18 | Discharge: 2015-08-18 | Disposition: A | Discharge status: Home / Self Care | Payer: 59 | Source: Ambulatory Visit | Attending: Radiation Oncology | Admitting: Radiation Oncology

## 2015-08-18 DIAGNOSIS — Z510 Encounter for antineoplastic radiation therapy: Secondary | ICD-10-CM | POA: Diagnosis not present

## 2015-08-19 ENCOUNTER — Ambulatory Visit
Admission: RE | Admit: 2015-08-19 | Discharge: 2015-08-19 | Disposition: A | Discharge status: Home / Self Care | Payer: 59 | Source: Ambulatory Visit | Attending: Radiation Oncology | Admitting: Radiation Oncology

## 2015-08-19 DIAGNOSIS — Z510 Encounter for antineoplastic radiation therapy: Secondary | ICD-10-CM | POA: Diagnosis not present

## 2015-08-20 ENCOUNTER — Ambulatory Visit
Admission: RE | Admit: 2015-08-20 | Discharge: 2015-08-20 | Disposition: A | Discharge status: Home / Self Care | Payer: 59 | Source: Ambulatory Visit | Attending: Radiation Oncology | Admitting: Radiation Oncology

## 2015-08-20 DIAGNOSIS — Z510 Encounter for antineoplastic radiation therapy: Secondary | ICD-10-CM | POA: Diagnosis not present

## 2015-08-21 ENCOUNTER — Ambulatory Visit
Admission: RE | Admit: 2015-08-21 | Discharge: 2015-08-21 | Disposition: A | Discharge status: Home / Self Care | Payer: 59 | Source: Ambulatory Visit | Attending: Radiation Oncology | Admitting: Radiation Oncology

## 2015-08-21 ENCOUNTER — Encounter: Payer: Self-pay | Admitting: Radiation Oncology

## 2015-08-21 ENCOUNTER — Other Ambulatory Visit: Payer: 59

## 2015-08-21 VITALS — BP 116/79 | HR 84 | Resp 16 | Wt 223.5 lb

## 2015-08-21 DIAGNOSIS — C61 Malignant neoplasm of prostate: Secondary | ICD-10-CM

## 2015-08-21 DIAGNOSIS — Z510 Encounter for antineoplastic radiation therapy: Secondary | ICD-10-CM | POA: Diagnosis not present

## 2015-08-21 NOTE — Progress Notes (Signed)
  Radiation Oncology         [phone]   Name: Eric Olsen MRN: [id number]   Date: 08/21/2015  DOB: October 10, 1960     Weekly Radiation Therapy Management    ICD-9-CM ICD-10-CM   1. Malignant neoplasm of prostate (HCC) 185 C61     Current Dose: 48.6 Gy  Planned Dose:  68.4 Gy  Narrative The patient presents for routine under treatment assessment.  Weight and vitals stable. The patient denies pain, dysuria, hematuria, incontinence, or leakage. The ultrasound for his gallbladder (right upper quadrant pain) has been pushed out until June 20 because he has been directed not to eat or drink prior to U/S, but he needs a full bladder for xrt. He reports nocturia x 1and constipation for 3 days with only a small BM yesterday despite increased fiber and Citracal. He is also fatigued.  Set-up films were reviewed. The chart was checked.  Physical Findings  weight is 223 lb 8 oz (101.379 kg). His blood pressure is 116/79 and his pulse is 84. His respiration is 16 and oxygen saturation is 100%.   In general this is a well appearing Caucasian male in no acute distress. He's alert and oriented x4 and appropriate throughout the examination. Cardiopulmonary assessment is negative for acute distress and he exhibits normal effort.    Impression The patient is tolerating radiation.  Plan Continue treatment as planned.  ------------------------------------------------   Tyler Pita, MD [place] Director and Director of Stereotactic Radiosurgery Direct Dial: [phone]  Fax: [phone] [email or web address]  Skype  [email or web address]  This document serves as a record of services personally performed by [employer]. It was created on her behalf by Darcus Austin, a trained medical scribe. The creation of this record is based on the scribe's personal observations and the provider's statements to them. This document has been checked and approved by the attending provider.

## 2015-08-21 NOTE — Progress Notes (Signed)
Weight and vitals stable. Denies pain. Push ultrasound for gallbladder out until June 20 because he has been directed not to eat or drink prior to U/S but, he needs a full bladder for xrt. Denies dysuria or hematuria.Reports nocturia x 1. Denies incontinence or leakage. Reports constipation x 3 days with only small BM yesterday despite increased fiber and Citracal. Reports fatigue.   BP 116/79 mmHg  Pulse 84  Resp 16  Wt 223 lb 8 oz (101.379 kg)  SpO2 100% Wt Readings from Last 3 Encounters:  08/21/15 223 lb 8 oz (101.379 kg)  08/14/15 225 lb 12.8 oz (102.422 kg)  08/13/15 220 lb (99.791 kg)

## 2015-08-22 ENCOUNTER — Ambulatory Visit
Admission: RE | Admit: 2015-08-22 | Discharge: 2015-08-22 | Disposition: A | Discharge status: Home / Self Care | Payer: 59 | Source: Ambulatory Visit | Attending: Radiation Oncology | Admitting: Radiation Oncology

## 2015-08-22 DIAGNOSIS — Z510 Encounter for antineoplastic radiation therapy: Secondary | ICD-10-CM | POA: Diagnosis not present

## 2015-08-23 ENCOUNTER — Ambulatory Visit: Payer: 59

## 2015-08-26 ENCOUNTER — Ambulatory Visit
Admission: RE | Admit: 2015-08-26 | Discharge: 2015-08-26 | Disposition: A | Discharge status: Home / Self Care | Payer: 59 | Source: Ambulatory Visit | Attending: Radiation Oncology | Admitting: Radiation Oncology

## 2015-08-26 DIAGNOSIS — Z510 Encounter for antineoplastic radiation therapy: Secondary | ICD-10-CM | POA: Diagnosis not present

## 2015-08-27 ENCOUNTER — Ambulatory Visit
Admission: RE | Admit: 2015-08-27 | Discharge: 2015-08-27 | Disposition: A | Discharge status: Home / Self Care | Payer: 59 | Source: Ambulatory Visit | Attending: Radiation Oncology | Admitting: Radiation Oncology

## 2015-08-27 DIAGNOSIS — Z510 Encounter for antineoplastic radiation therapy: Secondary | ICD-10-CM | POA: Diagnosis not present

## 2015-08-28 ENCOUNTER — Ambulatory Visit
Admission: RE | Admit: 2015-08-28 | Discharge: 2015-08-28 | Disposition: A | Discharge status: Home / Self Care | Payer: 59 | Source: Ambulatory Visit | Attending: Radiation Oncology | Admitting: Radiation Oncology

## 2015-08-28 DIAGNOSIS — Z510 Encounter for antineoplastic radiation therapy: Secondary | ICD-10-CM | POA: Diagnosis not present

## 2015-08-29 ENCOUNTER — Ambulatory Visit
Admission: RE | Admit: 2015-08-29 | Discharge: 2015-08-29 | Disposition: A | Discharge status: Home / Self Care | Payer: 59 | Source: Ambulatory Visit | Attending: Radiation Oncology | Admitting: Radiation Oncology

## 2015-08-29 VITALS — BP 128/83 | HR 89 | Resp 16 | Wt 222.6 lb

## 2015-08-29 DIAGNOSIS — C61 Malignant neoplasm of prostate: Secondary | ICD-10-CM

## 2015-08-29 DIAGNOSIS — Z510 Encounter for antineoplastic radiation therapy: Secondary | ICD-10-CM | POA: Diagnosis not present

## 2015-08-29 NOTE — Progress Notes (Signed)
  Radiation Oncology         [phone]   Name: Eric Olsen MRN: [id number]   Date: 08/29/2015  DOB: [birth year]     Weekly Radiation Therapy Management    ICD-9-CM ICD-10-CM   1. Malignant neoplasm of prostate (HCC) 185 C61     Current Dose: 57.6 Gy  Planned Dose:  68.4 Gy  Narrative The patient presents for routine under treatment assessment.  Weight and vitals stable. Denies pain. Denies dysuria or hematuria.Reports nocturia x 1. Denies urinary incontinence or leakage. Patient reports that on Memorial day he experience burning pain associated with bowel movement and saw bright red blood in stool. Goes onto recall that Tuesday he continued to experience pain associated with bowel movements but, not blood. Reports only mild discomfort with bowel movements since beginning stool softener and increasing Citracal. Reports fatigue.  Set-up films were reviewed. The chart was checked.  Physical Findings  weight is 222 lb 9.6 oz (100.971 kg). His blood pressure is 128/83 and his pulse is 89. His respiration is 16 and oxygen saturation is 100%.   In general this is a well appearing Caucasian male in no acute distress. He's alert and oriented x4 and appropriate throughout the examination. Cardiopulmonary assessment is negative for acute distress and he exhibits normal effort.    Impression The patient is tolerating radiation.  Plan Continue treatment as planned.     Tyler Pita, MD    This document serves as a record of services personally performed by Tyler Pita, MD. It was created on his behalf by Lendon Collar, a trained medical scribe. The creation of this record is based on the scribe's personal observations and the provider's statements to them. This document has been checked and approved by the attending provider.

## 2015-08-29 NOTE — Progress Notes (Signed)
Weight and vitals stable. Denies pain. Denies dysuria or hematuria.Reports nocturia x 1. Denies urinary incontinence or leakage.  Patient reports that on Memorial day he experience burning pain associated with bowel movement and saw bright red blood in stool. Goes onto recall that Tuesday he continued to experience pain associated with bowel movements but, not blood. Reports only mild discomfort with bowel movements since beginning stool softener and increasing Citracal.Reports fatigue.   BP 128/83 mmHg  Pulse 89  Resp 16  Wt 222 lb 9.6 oz (100.971 kg)  SpO2 100% Wt Readings from Last 3 Encounters:  08/29/15 222 lb 9.6 oz (100.971 kg)  08/21/15 223 lb 8 oz (101.379 kg)  08/14/15 225 lb 12.8 oz (102.422 kg)

## 2015-08-30 ENCOUNTER — Ambulatory Visit: Payer: 59

## 2015-09-01 ENCOUNTER — Ambulatory Visit
Admission: RE | Admit: 2015-09-01 | Discharge: 2015-09-01 | Disposition: A | Discharge status: Home / Self Care | Payer: 59 | Source: Ambulatory Visit | Attending: Radiation Oncology | Admitting: Radiation Oncology

## 2015-09-01 DIAGNOSIS — Z510 Encounter for antineoplastic radiation therapy: Secondary | ICD-10-CM | POA: Diagnosis not present

## 2015-09-02 ENCOUNTER — Ambulatory Visit
Admission: RE | Admit: 2015-09-02 | Discharge: 2015-09-02 | Disposition: A | Discharge status: Home / Self Care | Payer: 59 | Source: Ambulatory Visit | Attending: Radiation Oncology | Admitting: Radiation Oncology

## 2015-09-02 DIAGNOSIS — Z510 Encounter for antineoplastic radiation therapy: Secondary | ICD-10-CM | POA: Diagnosis not present

## 2015-09-03 ENCOUNTER — Ambulatory Visit
Admission: RE | Admit: 2015-09-03 | Discharge: 2015-09-03 | Disposition: A | Discharge status: Home / Self Care | Payer: 59 | Source: Ambulatory Visit | Attending: Radiation Oncology | Admitting: Radiation Oncology

## 2015-09-03 DIAGNOSIS — Z510 Encounter for antineoplastic radiation therapy: Secondary | ICD-10-CM | POA: Diagnosis not present

## 2015-09-04 ENCOUNTER — Encounter: Payer: Self-pay | Admitting: Radiation Oncology

## 2015-09-04 ENCOUNTER — Ambulatory Visit
Admission: RE | Admit: 2015-09-04 | Discharge: 2015-09-04 | Disposition: A | Discharge status: Home / Self Care | Payer: 59 | Source: Ambulatory Visit | Attending: Radiation Oncology | Admitting: Radiation Oncology

## 2015-09-04 VITALS — BP 124/81 | HR 90 | Resp 16 | Wt 223.9 lb

## 2015-09-04 DIAGNOSIS — C61 Malignant neoplasm of prostate: Secondary | ICD-10-CM

## 2015-09-04 DIAGNOSIS — Z510 Encounter for antineoplastic radiation therapy: Secondary | ICD-10-CM | POA: Diagnosis not present

## 2015-09-04 NOTE — Progress Notes (Signed)
  Radiation Oncology         [phone]   Name: Eric Olsen MRN: [id number]   Date: 09/04/2015  DOB: [birth year]     Weekly Radiation Therapy Management    ICD-9-CM ICD-10-CM   1. Malignant neoplasm of prostate (HCC) 185 C61     Current Dose: 64.8 Gy  Planned Dose:  68.4 Gy  Narrative The patient presents for routine under treatment assessment.  Weight and vitals stable. Denies pain. Denies dysuria or hematuria. Reports intense urinary urgency. Reports nocturia x 1. Denies urinary incontinence or leakage. Reports mild discomfort with bowel movements with stool softener and citracal. Reports increased fatigue.  Set-up films were reviewed. The chart was checked.  Physical Findings  weight is 223 lb 14.4 oz (101.56 kg). His blood pressure is 124/81 and his pulse is 90. His respiration is 16 and oxygen saturation is 100%.   In general this is a well appearing Caucasian male in no acute distress. He's alert and oriented x4 and appropriate throughout the examination. Cardiopulmonary assessment is negative for acute distress and he exhibits normal effort.    Impression The patient is tolerating radiation.  Plan Continue treatment as planned. The patient completes radiation treatment next Monday and a one month follow up appointment card was given.     Tyler Pita, MD  This document serves as a record of services personally performed by Tyler Pita, MD. It was created on his behalf by Darcus Austin, a trained medical scribe. The creation of this record is based on the scribe's personal observations and the provider's statements to them. This document has been checked and approved by the attending provider.

## 2015-09-04 NOTE — Progress Notes (Addendum)
Weight and vitals stable. Denies pain. Denies dysuria or hematuria. Reports intense urinary urgency. Reports nocturia x 1. Denies urinary incontinence or leakage. Reports mild discomfort with bowel movements with stool softener and citracal. Reports increased fatigue. One month follow up appointment card given.  BP 124/81 mmHg  Pulse 90  Resp 16  Wt 223 lb 14.4 oz (101.56 kg)  SpO2 100% Wt Readings from Last 3 Encounters:  09/04/15 223 lb 14.4 oz (101.56 kg)  08/29/15 222 lb 9.6 oz (100.971 kg)  08/21/15 223 lb 8 oz (101.379 kg)

## 2015-09-05 ENCOUNTER — Ambulatory Visit
Admission: RE | Admit: 2015-09-05 | Discharge: 2015-09-05 | Disposition: A | Discharge status: Home / Self Care | Payer: 59 | Source: Ambulatory Visit | Attending: Radiation Oncology | Admitting: Radiation Oncology

## 2015-09-05 ENCOUNTER — Ambulatory Visit: Payer: 59

## 2015-09-05 DIAGNOSIS — Z510 Encounter for antineoplastic radiation therapy: Secondary | ICD-10-CM | POA: Diagnosis not present

## 2015-09-08 ENCOUNTER — Ambulatory Visit: Payer: 59

## 2015-09-08 ENCOUNTER — Ambulatory Visit
Admission: RE | Admit: 2015-09-08 | Discharge: 2015-09-08 | Disposition: A | Discharge status: Home / Self Care | Payer: 59 | Source: Ambulatory Visit | Attending: Radiation Oncology | Admitting: Radiation Oncology

## 2015-09-08 ENCOUNTER — Encounter: Payer: Self-pay | Admitting: Radiation Oncology

## 2015-09-08 DIAGNOSIS — Z510 Encounter for antineoplastic radiation therapy: Secondary | ICD-10-CM | POA: Diagnosis not present

## 2015-09-09 ENCOUNTER — Ambulatory Visit: Payer: 59

## 2015-09-10 ENCOUNTER — Ambulatory Visit: Payer: 59

## 2015-09-16 ENCOUNTER — Ambulatory Visit
Admission: RE | Admit: 2015-09-16 | Discharge: 2015-09-16 | Disposition: A | Discharge status: Home / Self Care | Payer: 59 | Source: Ambulatory Visit | Attending: Gastroenterology | Admitting: Gastroenterology

## 2015-09-16 DIAGNOSIS — R1013 Epigastric pain: Secondary | ICD-10-CM

## 2015-09-18 NOTE — Progress Notes (Signed)
  Radiation Oncology         [phone] ________________________________  Name: Eric Olsen MRN: [id number]  Date: 09/08/2015  DOB: 1960-06-08  End of Treatment Note  ICD-9-CM ICD-10-CM     1. Malignant neoplasm of prostate ([place]) 185 C61    DIAGNOSIS: 55 y.o. 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     Indication for treatment:  Curative, Definitive Radiotherapy       Radiation treatment dates:   07/15/2015-09/08/2015  Site/dose:   The prostate was treated to 68.4 Gy in 38 fractions of 1.8 Gy  Beams/energy:   The patient was treated with IMRT using volumetric arc therapy delivering 6 MV X-rays to clockwise and counterclockwise circumferential arcs with a 90 degree collimator offset to avoid dose scalloping.  Image guidance was performed with daily cone beam CT prior to each fraction to align to gold markers in the prostate and assure proper bladder and rectal fill volumes.  Immobilization was achieved with BodyFix custom mold.  Narrative: The patient tolerated radiation treatment relatively well.   The patient experienced some minor urinary irritation and modest fatigue.    Plan: The patient has completed radiation treatment. He will return to radiation oncology clinic for routine followup in one month. I advised him to call or return sooner if he has any questions or concerns related to his recovery or treatment. ________________________________  Sheral Apley. Tammi Klippel, M.D.

## 2015-10-09 ENCOUNTER — Ambulatory Visit: Payer: Self-pay | Admitting: Radiation Oncology

## 2015-10-16 ENCOUNTER — Ambulatory Visit
Admission: RE | Admit: 2015-10-16 | Discharge: 2015-10-16 | Disposition: A | Discharge status: Home / Self Care | Payer: 59 | Source: Ambulatory Visit | Attending: Radiation Oncology | Admitting: Radiation Oncology

## 2015-10-16 ENCOUNTER — Encounter: Payer: Self-pay | Admitting: Radiation Oncology

## 2015-10-16 VITALS — BP 118/62 | HR 78 | Temp 98.2°F | Ht 71.0 in | Wt 216.2 lb

## 2015-10-16 DIAGNOSIS — C61 Malignant neoplasm of prostate: Secondary | ICD-10-CM | POA: Diagnosis present

## 2015-10-16 NOTE — Progress Notes (Signed)
1st visit to urology after XRT is scheduled for f 12/08/15.

## 2015-10-16 NOTE — Progress Notes (Signed)
Eric Olsen denies any dysuria, dribbling, difficulty starting stream.  Nocturia x 1. Complete emptying of bladder when voiding. Reports rectal itching and using preparation -H - using for the past 2-3 weeks.  Fatigue

## 2015-10-16 NOTE — Progress Notes (Signed)
Radiation Oncology         [phone] ________________________________  Name: Eric Olsen MRN: [id number]  Date: 10/16/2015  DOB: 12-Oct-1960  Follow-Up Visit Note  CC: Lynne Logan, MD  Rana Snare, MD  Diagnosis:   Stage pT3a (extracapsular extension) adenocarcinoma of the prostate with a Gleason's score of 3+4 and a rising PSA of 0.08 status post radical prostatectomy in 2013.    ICD-9-CM ICD-10-CM   1. Malignant neoplasm of prostate ([place]) 185 C61 Amb Referral to Survivorship Program    Interval Since Last Radiation:  4  weeks  07/15/2015-09/08/2015: The prostate was treated to 68.4 Gy in 38 fractions of 1.8 Gy  Narrative:  The patient returns today for routine follow-up.  The patient did extremely well and tolerating his radiotherapy, and was able to manage minor urinary irritation.  Since completing radiotherapy,The patient has been doing very well. He states that his urinary symptoms continue to improve. He is managing his fatigue with walking. He has lost 12 pounds since his last visit and is very pleased by this. No other complaints or verbalized.                            ALLERGIES:  has No Known Allergies.  Meds: Current Outpatient Prescriptions  Medication Sig Dispense Refill  . albuterol (PROVENTIL HFA;VENTOLIN HFA) 108 (90 BASE) MCG/ACT inhaler Inhale 2 puffs into the lungs every 6 (six) hours as needed. Wheezing     . aspirin 81 MG tablet Take 81 mg by mouth daily.    . Garlic [id number] MG CAPS Take 1 capsule by mouth daily.    Marland Kitchen lisinopril (PRINIVIL,ZESTRIL) 20 MG tablet Take 1 tablet (20 mg total) by mouth daily. 90 tablet 3  . Multiple Vitamin (MULTIVITAMIN) capsule Take 1 capsule by mouth daily.    . Omega-3 Krill Oil 1000 MG CAPS Take 1 capsule by mouth daily.    . pantoprazole (PROTONIX) 20 MG tablet Take 1 tablet (20 mg total) by mouth daily. 30 tablet 0  . rosuvastatin (CRESTOR) 20 MG tablet Take 0.5 tablets (10 mg total) by mouth daily. 45 tablet 3  .  Ascorbic Acid (VITAMIN C) 1000 MG tablet Take 1,000 mg by mouth daily. Reported on 10/16/2015     No current facility-administered medications for this encounter.    Physical Findings:  height is 5\' 11"  (1.803 m) and weight is 216 lb 3.2 oz (98.068 kg). His temperature is 98.2 F (36.8 C). His blood pressure is 118/62 and his pulse is 78.   In general this is a well appearing Caucasian male in no acute distress. He's alert and oriented x4 and appropriate throughout the examination. Cardiopulmonary assessment is negative for acute distress and he exhibits normal effort.   Lab Findings: Lab Results  Component Value Date   WBC 8.3 08/13/2015   HGB 14.9 08/13/2015   HCT 43.0 08/13/2015   PLT 141* 08/13/2015    Lab Results  Component Value Date   NA 139 08/13/2015   K 3.7 08/13/2015   CO2 30 08/13/2015   GLUCOSE 103* 08/13/2015   BUN 16 08/13/2015   CREATININE 0.98 08/13/2015   CREATININE 0.98 08/06/2015   BILITOT 0.5 08/13/2015   ALKPHOS 47 08/13/2015   AST 24 08/13/2015   ALT 15* 08/13/2015   PROT 7.4 08/13/2015   ALBUMIN 4.3 08/13/2015   CALCIUM 9.2 08/13/2015   ANIONGAP 8 08/13/2015    Radiographic Findings: No results  found.  Impression/Plan: 1. Stage pT3a (extracapsular extension) adenocarcinoma of the prostate with a Gleason's score of 3+4 and a rising PSA of 0.08 status post radical prostatectomy in 2013. The patient is unwilling completing his radiotherapy. He has plans to follow-up with Dr. Risa Grill for repeat PSA check in September 2017. We will be happy to see him back if there are any additional needs that he has for radiotherapy in the future but wished her well in terms of his cancer care in survivorship. 2. Survivorship. I discussed the options for meeting with our survivorship nurse practitioner and feel that he would benefit from consultation. We discussed the purposes of this including documentation of his care plan, and identification of additional screening  needs as well as resources available to him. He is interested in receiving documentation by male with is not interested in a formal consultation.     Carola Rhine, PAC

## 2016-01-25 ENCOUNTER — Emergency Department (HOSPITAL_COMMUNITY)
Admission: EM | Admit: 2016-01-25 | Discharge: 2016-01-26 | Disposition: A | Discharge status: Home / Self Care | Payer: 59 | Attending: Emergency Medicine | Admitting: Emergency Medicine

## 2016-01-25 ENCOUNTER — Encounter (HOSPITAL_COMMUNITY): Payer: Self-pay

## 2016-01-25 DIAGNOSIS — I10 Essential (primary) hypertension: Secondary | ICD-10-CM | POA: Insufficient documentation

## 2016-01-25 DIAGNOSIS — Z7982 Long term (current) use of aspirin: Secondary | ICD-10-CM | POA: Insufficient documentation

## 2016-01-25 DIAGNOSIS — K529 Noninfective gastroenteritis and colitis, unspecified: Secondary | ICD-10-CM

## 2016-01-25 DIAGNOSIS — J45909 Unspecified asthma, uncomplicated: Secondary | ICD-10-CM | POA: Diagnosis not present

## 2016-01-25 DIAGNOSIS — K2900 Acute gastritis without bleeding: Secondary | ICD-10-CM

## 2016-01-25 DIAGNOSIS — R52 Pain, unspecified: Secondary | ICD-10-CM

## 2016-01-25 DIAGNOSIS — R1013 Epigastric pain: Secondary | ICD-10-CM | POA: Diagnosis present

## 2016-01-25 DIAGNOSIS — Z8546 Personal history of malignant neoplasm of prostate: Secondary | ICD-10-CM | POA: Diagnosis not present

## 2016-01-25 LAB — COMPREHENSIVE METABOLIC PANEL
ALBUMIN: 3.9 g/dL (ref 3.5–5.0)
ALT: 15 U/L — AB (ref 17–63)
AST: 24 U/L (ref 15–41)
Alkaline Phosphatase: 49 U/L (ref 38–126)
Anion gap: 6 (ref 5–15)
BUN: 12 mg/dL (ref 6–20)
CHLORIDE: 106 mmol/L (ref 101–111)
CO2: 29 mmol/L (ref 22–32)
CREATININE: 0.95 mg/dL (ref 0.61–1.24)
Calcium: 9.3 mg/dL (ref 8.9–10.3)
GFR calc Af Amer: >60 mL/min (ref >60)
GLUCOSE: 107 mg/dL — AB (ref 65–99)
POTASSIUM: 3.6 mmol/L (ref 3.5–5.1)
Sodium: 141 mmol/L (ref 135–145)
Total Bilirubin: 0.3 mg/dL (ref 0.3–1.2)
Total Protein: 6.9 g/dL (ref 6.5–8.1)

## 2016-01-25 LAB — CBC
HEMATOCRIT: 43.5 % (ref 39.0–52.0)
Hemoglobin: 15.2 g/dL (ref 13.0–17.0)
MCH: 33 pg (ref 26.0–34.0)
MCHC: 34.9 g/dL (ref 30.0–36.0)
MCV: 94.6 fL (ref 78.0–100.0)
PLATELETS: 170 10*3/uL (ref 150–400)
RBC: 4.6 MIL/uL (ref 4.22–5.81)
RDW: 12 % (ref 11.5–15.5)
WBC: 6.7 10*3/uL (ref 4.0–10.5)

## 2016-01-25 LAB — URINALYSIS, ROUTINE W REFLEX MICROSCOPIC
Bilirubin Urine: NEGATIVE
GLUCOSE, UA: NEGATIVE mg/dL
HGB URINE DIPSTICK: NEGATIVE
KETONES UR: NEGATIVE mg/dL
LEUKOCYTES UA: NEGATIVE
Nitrite: NEGATIVE
PH: 6 (ref 5.0–8.0)
PROTEIN: NEGATIVE mg/dL
Specific Gravity, Urine: 1.017 (ref 1.005–1.030)

## 2016-01-25 LAB — LIPASE, BLOOD: LIPASE: 30 U/L (ref 11–51)

## 2016-01-25 NOTE — ED Triage Notes (Signed)
Onset 2 hours PTA constant upper mid abd pain, nausea, vomiting x 2.  Pt skin clammy.

## 2016-01-25 NOTE — ED Notes (Signed)
Pt is aware he needs a urine sample 

## 2016-01-25 NOTE — ED Provider Notes (Signed)
[facility] DEPT Provider Note   CSN: [id number] Arrival date & time: 01/25/16  2233  By signing my name below, I, [name], attest that this documentation has been prepared under the direction and in the presence of Arlie Riker, MD.  Electronically Signed: [name], ED Scribe. 01/25/16. 11:07 PM.    History   Chief Complaint Chief Complaint  Patient presents with  . Abdominal Pain   The history is provided by the patient. No language interpreter was used.  Abdominal Pain   This is a recurrent problem. The current episode started 3 to 5 hours ago. The problem occurs constantly. The problem has not changed since onset.The pain is associated with eating. The pain is located in the epigastric region. The quality of the pain is burning. The pain is severe. Associated symptoms include nausea and vomiting. Pertinent negatives include fever, diarrhea and constipation. Nothing aggravates the symptoms. Nothing relieves the symptoms.    HPI Comments: Eric Olsen is a 55 y.o. male with a PMHx of HTN who presents to the Emergency Department complaining of constant, unchanged epigastric abdominal pain onset 9:00 PM this evening. Pt states he had some pizza prior to onset of symptoms. Pain is described as burning. No aggravating or alleviating factors at this time. He also reports associated nausea and vomiting. 4 episodes of emesis reported since time of onset of abdominal pain. No OTC/prescribed medication attempted prior to arrival. No recent fever, chills, diarrhea, or constipation. Family reports 2 prior episodes of symptoms since the beginning of the year.  PCP: Lynne Logan, MD    Past Medical History:  Diagnosis Date  . Asthma    states well controlled  . Dyslipidemia   . History of DVT (deep vein thrombosis)    following achilles tendon surgery  . History of pulmonary embolus (PE)    following achilles tendon surgery   . Hypertension    cardio Dr Debara Pickett-  LOV  note 2/12 on chart  . Peripheral vascular disease (HCC)    hx DVT 10 years ago following achilles tendon tear & PE-  last anticoagulation years ago  . PONV (postoperative nausea and vomiting)   . Prostate cancer [facility])    prostate  . Recurrent upper respiratory infection (URI)    recent head cold- was on augumentin- states no fever- states resolved    Patient Active Problem List   Diagnosis Date Noted  . Insomnia 06/26/2014  . Dyslipidemia 06/15/2013  . History of DVT of lower extremity 06/15/2013  . HTN (hypertension) 06/15/2013  . Malignant neoplasm of prostate ([place]) 06/15/2013    Past Surgical History:  Procedure Laterality Date  . CARDIAC CATHETERIZATION  03/16/2005   RCA is large & dominant, no lesions, EF 60% (Dr. Domenic Moras)  . FACIAL NERVE DECOMPRESSION     right elbow- radial   . NM MYOCAR PERF WALL MOTION  2006   persantine study - inferior wall thinning; EF 63%; low risk  . PROSTATE BIOPSY    . ROBOT ASSISTED LAPAROSCOPIC RADICAL PROSTATECTOMY  04/28/2011   Procedure: ROBOTIC ASSISTED LAPAROSCOPIC RADICAL PROSTATECTOMY;  Surgeon: Bernestine Amass, MD;  Location: [facility] ORS;  Service: Urology;  Laterality: N/A;  . TONSILLECTOMY    . TRANSTHORACIC ECHOCARDIOGRAM  2009   EF=>55%, borderline conc LVH, trace MR/TR       Home Medications    Prior to Admission medications   Medication Sig Start Date End Date Taking? Authorizing Provider  albuterol (PROVENTIL HFA;VENTOLIN HFA) 108 (90  BASE) MCG/ACT inhaler Inhale 2 puffs into the lungs every 6 (six) hours as needed. Wheezing     Historical Provider, MD  Ascorbic Acid (VITAMIN C) 1000 MG tablet Take 1,000 mg by mouth daily. Reported on 10/16/2015    Historical Provider, MD  aspirin 81 MG tablet Take 81 mg by mouth daily.    Historical Provider, MD  Garlic [id number] MG CAPS Take 1 capsule by mouth daily.    Historical Provider, MD  lisinopril (PRINIVIL,ZESTRIL) 20 MG tablet Take 1 tablet (20 mg total) by mouth daily. 08/12/15    Pixie Casino, MD  Multiple Vitamin (MULTIVITAMIN) capsule Take 1 capsule by mouth daily.    Historical Provider, MD  Omega-3 Krill Oil 1000 MG CAPS Take 1 capsule by mouth daily.    Historical Provider, MD  pantoprazole (PROTONIX) 20 MG tablet Take 1 tablet (20 mg total) by mouth daily. 08/14/15   Ianna Salmela, MD  rosuvastatin (CRESTOR) 20 MG tablet Take 0.5 tablets (10 mg total) by mouth daily. 08/12/15   Pixie Casino, MD    Family History Family History  Problem Relation Age of Onset  . Heart attack Mother     Social History Social History  Substance Use Topics  . Smoking status: Never Smoker  . Smokeless tobacco: Never Used  . Alcohol use No     Allergies   Review of patient's allergies indicates no known allergies.   Review of Systems Review of Systems  Constitutional: Negative for chills and fever.  Cardiovascular: Negative for chest pain.  Gastrointestinal: Positive for abdominal pain, nausea and vomiting. Negative for constipation and diarrhea.  Psychiatric/Behavioral: Negative for confusion.  All other systems reviewed and are negative.    Physical Exam Updated Vital Signs BP (!) 152/102   Pulse 63   Temp 97.4 F (36.3 C) (Oral)   Resp 24   SpO2 98%   Physical Exam  Constitutional: He is oriented to person, place, and time. He appears well-developed and well-nourished.  HENT:  Head: Normocephalic and atraumatic.  Right Ear: External ear normal.  Left Ear: External ear normal.  Mouth/Throat: No oropharyngeal exudate.  Eyes: EOM are normal. Pupils are equal, round, and reactive to light.  Neck: Normal range of motion. Neck supple. No JVD present.  Cardiovascular: Normal rate, regular rhythm, normal heart sounds and intact distal pulses.   Pulmonary/Chest: Effort normal and breath sounds normal. No stridor. No respiratory distress. He has no wheezes. He has no rales.  Abdominal: Soft. He exhibits no distension and no mass. There is no tenderness.  There is no rebound and no guarding.  Extremely gassy on exam  Musculoskeletal: Normal range of motion.  Neurological: He is alert and oriented to person, place, and time. He has normal reflexes.  Skin: Skin is warm and dry. Capillary refill takes less than 2 seconds.  Psychiatric: He has a normal mood and affect. Judgment normal.  Nursing note and vitals reviewed.    ED Treatments / Results   Vitals:   01/26/16 0445 01/26/16 0501  BP: 129/96   Pulse: 74   Resp:    Temp:  97.8 F (36.6 C)   Results for orders placed or performed during the hospital encounter of 01/25/16  Lipase, blood  Result Value Ref Range   Lipase 30 11 - 51 U/L  Comprehensive metabolic panel  Result Value Ref Range   Sodium 141 135 - 145 mmol/L   Potassium 3.6 3.5 - 5.1 mmol/L   Chloride 106 101 -  111 mmol/L   CO2 29 22 - 32 mmol/L   Glucose, Bld 107 (H) 65 - 99 mg/dL   BUN 12 6 - 20 mg/dL   Creatinine, Ser 0.95 0.61 - 1.24 mg/dL   Calcium 9.3 8.9 - 10.3 mg/dL   Total Protein 6.9 6.5 - 8.1 g/dL   Albumin 3.9 3.5 - 5.0 g/dL   AST 24 15 - 41 U/L   ALT 15 (L) 17 - 63 U/L   Alkaline Phosphatase 49 38 - 126 U/L   Total Bilirubin 0.3 0.3 - 1.2 mg/dL   GFR calc non Af Amer >60 >60 mL/min   GFR calc Af Amer >60 >60 mL/min   Anion gap 6 5 - 15  CBC  Result Value Ref Range   WBC 6.7 4.0 - 10.5 K/uL   RBC 4.60 4.22 - 5.81 MIL/uL   Hemoglobin 15.2 13.0 - 17.0 g/dL   HCT 43.5 39.0 - 52.0 %   MCV 94.6 78.0 - 100.0 fL   MCH 33.0 26.0 - 34.0 pg   MCHC 34.9 30.0 - 36.0 g/dL   RDW 12.0 11.5 - 15.5 %   Platelets 170 150 - 400 K/uL  Urinalysis, Routine w reflex microscopic  Result Value Ref Range   Color, Urine YELLOW YELLOW   APPearance CLEAR CLEAR   Specific Gravity, Urine 1.017 1.005 - 1.030   pH 6.0 5.0 - 8.0   Glucose, UA NEGATIVE NEGATIVE mg/dL   Hgb urine dipstick NEGATIVE NEGATIVE   Bilirubin Urine NEGATIVE NEGATIVE   Ketones, ur NEGATIVE NEGATIVE mg/dL   Protein, ur NEGATIVE NEGATIVE mg/dL    Nitrite NEGATIVE NEGATIVE   Leukocytes, UA NEGATIVE NEGATIVE   Ct Renal Stone Study  Result Date: 01/26/2016 CLINICAL DATA:  Epigastric pain, nausea and vomiting. EXAM: CT ABDOMEN AND PELVIS WITHOUT CONTRAST TECHNIQUE: Multidetector CT imaging of the abdomen and pelvis was performed following the standard protocol without IV contrast. COMPARISON:  None. FINDINGS: Lower chest: No acute abnormality. Hepatobiliary: No focal liver abnormality is evident. There probably are small calculi within the elbow. No CT evidence of acute cholecystitis. No bile duct dilatation. Pancreas: Unremarkable. No pancreatic ductal dilatation or surrounding inflammatory changes. Spleen: Normal in size without focal abnormality. Adrenals/Urinary Tract: Adrenal glands are unremarkable. Kidneys are normal, without renal calculi, focal lesion, or hydronephrosis. There is mild urinary bladder mural thickening which may relate to prostate cancer treatment. Stomach/Bowel: The stomach and small bowel are unremarkable. Appendix is normal. Colon is remarkable only for mild mural thickening in the left hemicolon without obstruction. Colitis is a possibility, although it appears more likely chronic rather than acute. Vascular/Lymphatic: No significant vascular findings are present. No enlarged abdominal or pelvic lymph nodes. Reproductive: Changes consistent with the described history of treated prostate cancer. No masses are evident. Other: No abdominal wall hernia or abnormality. No abdominopelvic ascites. Musculoskeletal: No acute or significant osseous findings. IMPRESSION: Mild left hemicolon mural thickening. This may represent colitis although it does not appear acutely inflamed and this may be chronic. There also is mild urinary bladder mural thickening of indeterminate chronicity, perhaps related to prostate cancer treatment. Electronically Signed   By: Andreas Newport M.D.   On: 01/26/2016 00:51   DIAGNOSTIC STUDIES: Oxygen  Saturation is 98% on RA, Normal  by my interpretation.    COORDINATION OF CARE: 12:09 AM- Will order blood work and imaging. Discussed treatment plan with pt at bedside and pt agreed to plan.     Procedures Procedures (including critical care time)  Medications Ordered in ED Medications  ondansetron (ZOFRAN) injection 4 mg (4 mg Intravenous Given 01/26/16 0013)  sodium chloride 0.9 % bolus 1,000 mL (0 mLs Intravenous Stopped 01/26/16 0251)  dicyclomine (BENTYL) injection 20 mg (20 mg Intramuscular Given 01/26/16 0013)  gi cocktail (Maalox,Lidocaine,Donnatal) (30 mLs Oral Given 01/26/16 0026)  ketorolac (TORADOL) 15 MG/ML injection 30 mg (30 mg Intravenous Given 01/26/16 0035)  ciprofloxacin (CIPRO) tablet 500 mg (500 mg Oral Given 01/26/16 0252)  metroNIDAZOLE (FLAGYL) tablet 500 mg (500 mg Oral Given 01/26/16 0252)    Initially did not improve with medication, then markedly improved pain is epigastric I firmly believe this is again gastritis and not colitis.  I believe the colitis to be secondary to the patient's h/o radiation though without a previous CT scan I will treat with cipro flagyl.  I believe the patient likely has H. Pylori and have advised the patient to start carafate and protonix eat a GERD friendly diet and follow up with Dr. Cristina Gong (his GI doctor) for endoscopy and colonoscopy   Final Clinical Impressions(s) / ED Diagnoses   All questions answered to patient's parents satisfaction. Based on history and exam patient has been appropriately medically screened and emergency conditions excluded. Patient is stable for discharge at this time. Follow up with your PMD for recheck in 2 days and strict return precautions given.   New Prescriptions New Prescriptions   No medications on file  I personally performed the services described in this documentation, which was scribed in my presence. The recorded information has been reviewed and is accurate.      Veatrice Kells,  MD 01/26/16 [phone]

## 2016-01-25 NOTE — ED Notes (Signed)
MD notified of patients status and request for pain medication

## 2016-01-25 NOTE — ED Triage Notes (Signed)
Pt bent over in wheelchair, grabbing abdomen, c/o severe abd pain.

## 2016-01-25 NOTE — ED Triage Notes (Signed)
Pt took protonix and zofran SL with no relief.

## 2016-01-26 ENCOUNTER — Observation Stay (HOSPITAL_BASED_OUTPATIENT_CLINIC_OR_DEPARTMENT_OTHER)
Admission: EM | Admit: 2016-01-26 | Discharge: 2016-01-29 | Disposition: A | Discharge status: Home / Self Care | Payer: 59 | Attending: General Surgery | Admitting: General Surgery

## 2016-01-26 ENCOUNTER — Emergency Department (HOSPITAL_BASED_OUTPATIENT_CLINIC_OR_DEPARTMENT_OTHER): Payer: 59

## 2016-01-26 ENCOUNTER — Emergency Department (HOSPITAL_COMMUNITY): Payer: 59

## 2016-01-26 ENCOUNTER — Encounter (HOSPITAL_BASED_OUTPATIENT_CLINIC_OR_DEPARTMENT_OTHER): Payer: Self-pay | Admitting: Emergency Medicine

## 2016-01-26 ENCOUNTER — Encounter (HOSPITAL_COMMUNITY): Payer: Self-pay | Admitting: Emergency Medicine

## 2016-01-26 DIAGNOSIS — K829 Disease of gallbladder, unspecified: Secondary | ICD-10-CM

## 2016-01-26 DIAGNOSIS — J45909 Unspecified asthma, uncomplicated: Secondary | ICD-10-CM | POA: Diagnosis not present

## 2016-01-26 DIAGNOSIS — R52 Pain, unspecified: Secondary | ICD-10-CM

## 2016-01-26 DIAGNOSIS — E785 Hyperlipidemia, unspecified: Secondary | ICD-10-CM | POA: Diagnosis not present

## 2016-01-26 DIAGNOSIS — I10 Essential (primary) hypertension: Secondary | ICD-10-CM | POA: Diagnosis not present

## 2016-01-26 DIAGNOSIS — K8012 Calculus of gallbladder with acute and chronic cholecystitis without obstruction: Principal | ICD-10-CM | POA: Insufficient documentation

## 2016-01-26 DIAGNOSIS — G4700 Insomnia, unspecified: Secondary | ICD-10-CM | POA: Diagnosis not present

## 2016-01-26 DIAGNOSIS — Z7982 Long term (current) use of aspirin: Secondary | ICD-10-CM | POA: Diagnosis not present

## 2016-01-26 DIAGNOSIS — Z86718 Personal history of other venous thrombosis and embolism: Secondary | ICD-10-CM | POA: Insufficient documentation

## 2016-01-26 DIAGNOSIS — Z8546 Personal history of malignant neoplasm of prostate: Secondary | ICD-10-CM | POA: Insufficient documentation

## 2016-01-26 DIAGNOSIS — K8020 Calculus of gallbladder without cholecystitis without obstruction: Secondary | ICD-10-CM

## 2016-01-26 DIAGNOSIS — I739 Peripheral vascular disease, unspecified: Secondary | ICD-10-CM | POA: Diagnosis not present

## 2016-01-26 DIAGNOSIS — K219 Gastro-esophageal reflux disease without esophagitis: Secondary | ICD-10-CM | POA: Diagnosis not present

## 2016-01-26 DIAGNOSIS — K8000 Calculus of gallbladder with acute cholecystitis without obstruction: Secondary | ICD-10-CM | POA: Diagnosis present

## 2016-01-26 DIAGNOSIS — K810 Acute cholecystitis: Secondary | ICD-10-CM | POA: Diagnosis present

## 2016-01-26 DIAGNOSIS — K819 Cholecystitis, unspecified: Secondary | ICD-10-CM | POA: Diagnosis present

## 2016-01-26 LAB — COMPREHENSIVE METABOLIC PANEL
ALBUMIN: 4.5 g/dL (ref 3.5–5.0)
ALT: 17 U/L (ref 17–63)
ANION GAP: 7 (ref 5–15)
AST: 26 U/L (ref 15–41)
Alkaline Phosphatase: 52 U/L (ref 38–126)
BUN: 18 mg/dL (ref 6–20)
CO2: 27 mmol/L (ref 22–32)
Calcium: 9.2 mg/dL (ref 8.9–10.3)
Chloride: 104 mmol/L (ref 101–111)
Creatinine, Ser: 0.85 mg/dL (ref 0.61–1.24)
GFR calc Af Amer: >60 mL/min (ref >60)
GFR calc non Af Amer: >60 mL/min (ref >60)
GLUCOSE: 98 mg/dL (ref 65–99)
POTASSIUM: 4.1 mmol/L (ref 3.5–5.1)
SODIUM: 138 mmol/L (ref 135–145)
TOTAL PROTEIN: 7.9 g/dL (ref 6.5–8.1)
Total Bilirubin: 0.8 mg/dL (ref 0.3–1.2)

## 2016-01-26 LAB — CBC
HEMATOCRIT: 40.9 % (ref 39.0–52.0)
HEMOGLOBIN: 14.3 g/dL (ref 13.0–17.0)
MCH: 33.4 pg (ref 26.0–34.0)
MCHC: 35 g/dL (ref 30.0–36.0)
MCV: 95.6 fL (ref 78.0–100.0)
Platelets: 145 10*3/uL — ABNORMAL LOW (ref 150–400)
RBC: 4.28 MIL/uL (ref 4.22–5.81)
RDW: 12.5 % (ref 11.5–15.5)
WBC: 11.1 10*3/uL — ABNORMAL HIGH (ref 4.0–10.5)

## 2016-01-26 LAB — CREATININE, SERUM
CREATININE: 0.96 mg/dL (ref 0.61–1.24)
GFR calc Af Amer: >60 mL/min (ref >60)
GFR calc non Af Amer: >60 mL/min (ref >60)

## 2016-01-26 LAB — CBC WITH DIFFERENTIAL/PLATELET
BASOS PCT: 0 %
Basophils Absolute: 0 10*3/uL (ref 0.0–0.1)
EOS ABS: 0.2 10*3/uL (ref 0.0–0.7)
Eosinophils Relative: 2 %
HEMATOCRIT: 43.8 % (ref 39.0–52.0)
HEMOGLOBIN: 15.2 g/dL (ref 13.0–17.0)
LYMPHS ABS: 0.6 10*3/uL — AB (ref 0.7–4.0)
Lymphocytes Relative: 5 %
MCH: 33 pg (ref 26.0–34.0)
MCHC: 34.7 g/dL (ref 30.0–36.0)
MCV: 95 fL (ref 78.0–100.0)
MONOS PCT: 8 %
Monocytes Absolute: 0.9 10*3/uL (ref 0.1–1.0)
NEUTROS ABS: 9.2 10*3/uL — AB (ref 1.7–7.7)
NEUTROS PCT: 85 %
Platelets: 151 10*3/uL (ref 150–400)
RBC: 4.61 MIL/uL (ref 4.22–5.81)
RDW: 12.2 % (ref 11.5–15.5)
WBC: 10.9 10*3/uL — AB (ref 4.0–10.5)

## 2016-01-26 LAB — SAMPLE TO BLOOD BANK

## 2016-01-26 LAB — LIPASE, BLOOD: Lipase: 24 U/L (ref 11–51)

## 2016-01-26 MED ORDER — HEPARIN SODIUM (PORCINE) 5000 UNIT/ML IJ SOLN
5000.0000 [IU] | Freq: Three times a day (TID) | INTRAMUSCULAR | Status: DC
Start: 2016-01-26 — End: 2016-01-27
  Administered 2016-01-26: 5000 [IU] via SUBCUTANEOUS
  Filled 2016-01-26: qty 1

## 2016-01-26 MED ORDER — HYDROMORPHONE HCL 1 MG/ML IJ SOLN: 1.0000 mg | INTRAMUSCULAR | Status: DC | PRN

## 2016-01-26 MED ORDER — HYDROMORPHONE HCL 1 MG/ML IJ SOLN
0.5000 mg | INTRAMUSCULAR | Status: DC | PRN
  Administered 2016-01-26 – 2016-01-27 (×3): 0.5 mg via INTRAVENOUS
  Filled 2016-01-26 (×3): qty 1

## 2016-01-26 MED ORDER — ONDANSETRON HCL 4 MG/2ML IJ SOLN: 4.0000 mg | Freq: Four times a day (QID) | INTRAMUSCULAR | Status: DC | PRN

## 2016-01-26 MED ORDER — CIPROFLOXACIN HCL 500 MG PO TABS
500.0000 mg | ORAL_TABLET | Freq: Once | ORAL | Status: AC
  Administered 2016-01-26: 500 mg via ORAL
  Filled 2016-01-26: qty 1

## 2016-01-26 MED ORDER — HYDROMORPHONE HCL 1 MG/ML IJ SOLN
1.0000 mg | Freq: Once | INTRAMUSCULAR | Status: AC
  Administered 2016-01-26: 1 mg via INTRAVENOUS
  Filled 2016-01-26: qty 1

## 2016-01-26 MED ORDER — DEXTROSE 5 % IV SOLN
2.0000 g | Freq: Once | INTRAVENOUS | Status: AC
  Administered 2016-01-26: 2 g via INTRAVENOUS
  Filled 2016-01-26: qty 2

## 2016-01-26 MED ORDER — PIPERACILLIN-TAZOBACTAM 3.375 G IVPB
3.3750 g | Freq: Three times a day (TID) | INTRAVENOUS | Status: DC
  Administered 2016-01-26 – 2016-01-29 (×8): 3.375 g via INTRAVENOUS
  Filled 2016-01-26 (×10): qty 50

## 2016-01-26 MED ORDER — GI COCKTAIL ~~LOC~~
30.0000 mL | Freq: Once | ORAL | Status: AC
  Administered 2016-01-26: 30 mL via ORAL
  Filled 2016-01-26: qty 30

## 2016-01-26 MED ORDER — KCL IN DEXTROSE-NACL 20-5-0.45 MEQ/L-%-% IV SOLN
INTRAVENOUS | Status: DC
  Administered 2016-01-26 – 2016-01-28 (×4): via INTRAVENOUS
  Filled 2016-01-26 (×5): qty 1000

## 2016-01-26 MED ORDER — FAMOTIDINE IN NACL 20-0.9 MG/50ML-% IV SOLN
20.0000 mg | Freq: Two times a day (BID) | INTRAVENOUS | Status: DC
  Administered 2016-01-26 – 2016-01-28 (×4): 20 mg via INTRAVENOUS
  Filled 2016-01-26 (×5): qty 50

## 2016-01-26 MED ORDER — SUCRALFATE 1 GM/10ML PO SUSP: 1.0000 g | Freq: Three times a day (TID) | ORAL | 0 refills | Status: DC

## 2016-01-26 MED ORDER — PANTOPRAZOLE SODIUM 20 MG PO TBEC: 20.0000 mg | DELAYED_RELEASE_TABLET | Freq: Every day | ORAL | 0 refills | Status: DC

## 2016-01-26 MED ORDER — SODIUM CHLORIDE 0.9 % IV SOLN
INTRAVENOUS | Status: DC
  Administered 2016-01-26: 21:00:00 via INTRAVENOUS

## 2016-01-26 MED ORDER — CIPROFLOXACIN HCL 500 MG PO TABS: 500.0000 mg | ORAL_TABLET | Freq: Two times a day (BID) | ORAL | 0 refills | Status: DC

## 2016-01-26 MED ORDER — ONDANSETRON 4 MG PO TBDP: 4.0000 mg | ORAL_TABLET | Freq: Four times a day (QID) | ORAL | Status: DC | PRN

## 2016-01-26 MED ORDER — ONDANSETRON HCL 4 MG/2ML IJ SOLN
4.0000 mg | Freq: Once | INTRAMUSCULAR | Status: AC
  Administered 2016-01-26: 4 mg via INTRAVENOUS
  Filled 2016-01-26: qty 2

## 2016-01-26 MED ORDER — LISINOPRIL 20 MG PO TABS
20.0000 mg | ORAL_TABLET | Freq: Every day | ORAL | Status: DC
  Administered 2016-01-28: 20 mg via ORAL
  Filled 2016-01-26: qty 1

## 2016-01-26 MED ORDER — HYDROMORPHONE HCL 1 MG/ML IJ SOLN
1.0000 mg | INTRAMUSCULAR | Status: AC
Start: 2016-01-26 — End: 2016-01-26
  Administered 2016-01-26: 1 mg via INTRAVENOUS
  Filled 2016-01-26: qty 1

## 2016-01-26 MED ORDER — METRONIDAZOLE 500 MG PO TABS: 500.0000 mg | ORAL_TABLET | Freq: Three times a day (TID) | ORAL | 0 refills | Status: DC

## 2016-01-26 MED ORDER — SODIUM CHLORIDE 0.9 % IV BOLUS (SEPSIS)
1000.0000 mL | Freq: Once | INTRAVENOUS | Status: AC
  Administered 2016-01-26: 1000 mL via INTRAVENOUS

## 2016-01-26 MED ORDER — DICYCLOMINE HCL 10 MG/ML IM SOLN
20.0000 mg | Freq: Once | INTRAMUSCULAR | Status: AC
  Administered 2016-01-26: 20 mg via INTRAMUSCULAR
  Filled 2016-01-26: qty 2

## 2016-01-26 MED ORDER — HYDROMORPHONE HCL 1 MG/ML IJ SOLN
1.0000 mg | Freq: Once | INTRAMUSCULAR | Status: AC
  Administered 2016-01-26: 1 mg via INTRAVENOUS

## 2016-01-26 MED ORDER — METRONIDAZOLE 500 MG PO TABS
500.0000 mg | ORAL_TABLET | Freq: Once | ORAL | Status: AC
  Administered 2016-01-26: 500 mg via ORAL
  Filled 2016-01-26: qty 1

## 2016-01-26 MED ORDER — KETOROLAC TROMETHAMINE 15 MG/ML IJ SOLN
30.0000 mg | Freq: Once | INTRAMUSCULAR | Status: AC
  Administered 2016-01-26: 30 mg via INTRAVENOUS

## 2016-01-26 MED ORDER — ALBUTEROL SULFATE (2.5 MG/3ML) 0.083% IN NEBU: 2.5000 mg | INHALATION_SOLUTION | Freq: Four times a day (QID) | RESPIRATORY_TRACT | Status: DC | PRN

## 2016-01-26 MED ORDER — HYDROMORPHONE HCL 1 MG/ML IJ SOLN
INTRAMUSCULAR | Status: AC
  Administered 2016-01-26: 1 mg via INTRAVENOUS
  Filled 2016-01-26: qty 1

## 2016-01-26 MED ORDER — ONDANSETRON HCL 4 MG/2ML IJ SOLN: 4.0000 mg | Freq: Three times a day (TID) | INTRAMUSCULAR | Status: DC | PRN

## 2016-01-26 NOTE — ED Provider Notes (Signed)
[facility] DEPT [facility] Provider Note   CSN: [id number] Arrival date & time: 01/26/16  1134     History   Chief Complaint Chief Complaint  Patient presents with  . Chest Pain  . Abdominal Pain    HPI Eric Olsen is a 55 y.o. male.  HPI   Patient with hx prostate cancer s/p surgery and radiation, PE remotely following surgery, HTN, HLD p/w epigastric pain, N/V that began yesterday.  The pain began around 8:30pm, 2 hours after eating pizza.  He had persistent vomiting last night, improved after ED visit with reassuring workup.  Pt spoke with his gastroenterologist Dr Cristina Gong today who noted there was a misread on the CT and pt in fact has gallstones.  Pt developed increased pain and returned to ED.  Pain is constant, cramping, epigastric, worse with lying flat and with palpation.  Also has pain between his shoulder blades that began at the same time. No further vomiting today.  Last PO was 5am toast with thin layer of jelly.  Is having normal bowel movements, last two days ago, light blood on toilet paper, which is not abnormal for him.  Denies fevers, chills, myalgias, chest pain, SOB, cough, hemoptysis, urinary symptoms.    Pt has never had upper endoscopy.  Does not drink ETOH.  Takes only occasional NSAIDs.   Past Medical History:  Diagnosis Date  . Asthma    states well controlled  . Dyslipidemia   . History of DVT (deep vein thrombosis)    following achilles tendon surgery  . History of pulmonary embolus (PE)    following achilles tendon surgery   . Hypertension    cardio Dr Debara Pickett-  LOV note 2/12 on chart  . Peripheral vascular disease (HCC)    hx DVT 10 years ago following achilles tendon tear & PE-  last anticoagulation years ago  . PONV (postoperative nausea and vomiting)   . Prostate cancer [facility])    prostate  . Recurrent upper respiratory infection (URI)    recent head cold- was on augumentin- states no fever- states resolved    Patient Active Problem List     Diagnosis Date Noted  . Acute cholecystitis 01/26/2016  . Insomnia 06/26/2014  . Dyslipidemia 06/15/2013  . History of DVT of lower extremity 06/15/2013  . HTN (hypertension) 06/15/2013  . Malignant neoplasm of prostate ([place]) 06/15/2013    Past Surgical History:  Procedure Laterality Date  . CARDIAC CATHETERIZATION  03/16/2005   RCA is large & dominant, no lesions, EF 60% (Dr. Domenic Moras)  . FACIAL NERVE DECOMPRESSION     right elbow- radial   . NM MYOCAR PERF WALL MOTION  2006   persantine study - inferior wall thinning; EF 63%; low risk  . PROSTATE BIOPSY    . ROBOT ASSISTED LAPAROSCOPIC RADICAL PROSTATECTOMY  04/28/2011   Procedure: ROBOTIC ASSISTED LAPAROSCOPIC RADICAL PROSTATECTOMY;  Surgeon: Bernestine Amass, MD;  Location: [facility];  Service: Urology;  Laterality: N/A;  . TONSILLECTOMY    . TRANSTHORACIC ECHOCARDIOGRAM  2009   EF=>55%, borderline conc LVH, trace MR/TR       Home Medications    Prior to Admission medications   Medication Sig Start Date End Date Taking? Authorizing Provider  albuterol (PROVENTIL HFA;VENTOLIN HFA) 108 (90 BASE) MCG/ACT inhaler Inhale 2 puffs into the lungs every 6 (six) hours as needed for wheezing or shortness of breath.     Historical Provider, MD  aspirin 81 MG tablet Take 81 mg by mouth  daily.    Historical Provider, MD  ciprofloxacin (CIPRO) 500 MG tablet Take 1 tablet (500 mg total) by mouth 2 (two) times daily. One po bid x 7 days 01/26/16   April Palumbo, MD  Garlic [id number] MG CAPS Take 1 capsule by mouth daily.    Historical Provider, MD  lisinopril (PRINIVIL,ZESTRIL) 20 MG tablet Take 1 tablet (20 mg total) by mouth daily. 08/12/15   Pixie Casino, MD  metroNIDAZOLE (FLAGYL) 500 MG tablet Take 1 tablet (500 mg total) by mouth 3 (three) times daily. 01/26/16   April Palumbo, MD  Multiple Vitamin (MULTIVITAMIN) capsule Take 1 capsule by mouth daily.    Historical Provider, MD  Omega-3 Krill Oil 1000 MG CAPS Take 1 capsule by mouth daily.     Historical Provider, MD  pantoprazole (PROTONIX) 20 MG tablet Take 1 tablet (20 mg total) by mouth daily. 01/26/16   April Palumbo, MD  rosuvastatin (CRESTOR) 20 MG tablet Take 0.5 tablets (10 mg total) by mouth daily. 08/12/15   Pixie Casino, MD  sucralfate (CARAFATE) 1 GM/10ML suspension Take 10 mLs (1 g total) by mouth 4 (four) times daily -  with meals and at bedtime. 01/26/16   April Palumbo, MD    Family History Family History  Problem Relation Age of Onset  . Heart attack Mother     Social History Social History  Substance Use Topics  . Smoking status: Never Smoker  . Smokeless tobacco: Never Used  . Alcohol use No     Allergies   Review of patient's allergies indicates no known allergies.   Review of Systems Review of Systems  All other systems reviewed and are negative.    Physical Exam Updated Vital Signs BP 130/93   Pulse 93   Temp 98.4 F (36.9 C) (Oral)   Resp 14   Ht 5\' 11"  (1.803 m)   Wt 96.6 kg   SpO2 91%   BMI 29.71 kg/m   Physical Exam  Constitutional: He appears well-developed and well-nourished. No distress.  HENT:  Head: Normocephalic and atraumatic.  Neck: Neck supple.  Cardiovascular: Normal rate and regular rhythm.   Pulmonary/Chest: Effort normal and breath sounds normal. No respiratory distress. He has no wheezes. He has no rales.  Abdominal: Soft. He exhibits no distension and no mass. There is tenderness (epigastric ). There is no rebound, no guarding and negative Murphy's sign.  Neurological: He is alert. He exhibits normal muscle tone.  Skin: He is not diaphoretic.  Nursing note and vitals reviewed.    ED Treatments / Results  Labs (all labs ordered are listed, but only abnormal results are displayed) Labs Reviewed  CBC WITH DIFFERENTIAL/PLATELET - Abnormal; Notable for the following:       Result Value   WBC 10.9 (*)    Neutro Abs 9.2 (*)    Lymphs Abs 0.6 (*)    All other components within normal limits   COMPREHENSIVE METABOLIC PANEL  LIPASE, BLOOD    EKG  EKG Interpretation  Date/Time:  Monday January 26 2016 11:52:49 EDT Ventricular Rate:  76 PR Interval:    QRS Duration: 93 QT Interval:  368 QTC Calculation: 414 R Axis:   45 Text Interpretation:  Sinus rhythm Low voltage, precordial leads normal. no change from previous Confirmed by Johnney Killian, MD, Jeannie Done [phone]) on 01/26/2016 12:04:27 PM       Radiology Dg Abd Acute W/chest  Result Date: 01/26/2016 CLINICAL DATA:  Epigastric abdominal pain with nausea since last night.  History of hypertension, asthma and pulmonary embolism. EXAM: DG ABDOMEN ACUTE W/ 1V CHEST COMPARISON:  Abdominal CT 01/26/2016.  Radiographs 08/14/2015. FINDINGS: The heart size and mediastinal contours are normal. The lungs are clear. There is no pleural effusion or pneumothorax. No acute osseous findings are identified. The bowel gas pattern is normal. There is no free intraperitoneal air or suspicious abdominal calcification. No acute osseous findings are seen. IMPRESSION: No active cardiopulmonary or abdominal process. Electronically Signed   By: Richardean Sale M.D.   On: 01/26/2016 13:38   Ct Renal Stone Study  Addendum Date: 01/26/2016   ADDENDUM REPORT: 01/26/2016 10:58 ADDENDUM: Hepatobiliary: No focal liver abnormality identified. Sludge versus tiny stones (less than 4 mm) noted layering within the dependent portion of the gallbladder. No gallbladder wall thickening and no biliary dilatation. Electronically Signed   By: Kerby Moors M.D.   On: 01/26/2016 10:58   Result Date: 01/26/2016 CLINICAL DATA:  Epigastric pain, nausea and vomiting. EXAM: CT ABDOMEN AND PELVIS WITHOUT CONTRAST TECHNIQUE: Multidetector CT imaging of the abdomen and pelvis was performed following the standard protocol without IV contrast. COMPARISON:  None. FINDINGS: Lower chest: No acute abnormality. Hepatobiliary: No focal liver abnormality is evident. There probably are small  calculi within the elbow. No CT evidence of acute cholecystitis. No bile duct dilatation. Pancreas: Unremarkable. No pancreatic ductal dilatation or surrounding inflammatory changes. Spleen: Normal in size without focal abnormality. Adrenals/Urinary Tract: Adrenal glands are unremarkable. Kidneys are normal, without renal calculi, focal lesion, or hydronephrosis. There is mild urinary bladder mural thickening which may relate to prostate cancer treatment. Stomach/Bowel: The stomach and small bowel are unremarkable. Appendix is normal. Colon is remarkable only for mild mural thickening in the left hemicolon without obstruction. Colitis is a possibility, although it appears more likely chronic rather than acute. Vascular/Lymphatic: No significant vascular findings are present. No enlarged abdominal or pelvic lymph nodes. Reproductive: Changes consistent with the described history of treated prostate cancer. No masses are evident. Other: No abdominal wall hernia or abnormality. No abdominopelvic ascites. Musculoskeletal: No acute or significant osseous findings. IMPRESSION: Mild left hemicolon mural thickening. This may represent colitis although it does not appear acutely inflamed and this may be chronic. There also is mild urinary bladder mural thickening of indeterminate chronicity, perhaps related to prostate cancer treatment. Electronically Signed: By: Andreas Newport M.D. On: 01/26/2016 00:51   US Abdomen Limited Ruq  Result Date: 01/26/2016 CLINICAL DATA:  Chronic right upper quadrant pain with acute episode 1 day prior EXAM: US ABDOMEN LIMITED - RIGHT UPPER QUADRANT COMPARISON:  Abdominal ultrasound September 16, 2015 and CT abdomen and pelvis January 26, 2016 FINDINGS: Gallbladder: Within the gallbladder, there is sludge as well as scattered small echogenic foci which move and shadow consistent with gallstones. Largest gallstone measures 3 mm in thickness. Gallbladder appears mildly distended with mildly  edematous and thickened wall. No pericholecystic fluid evident. No sonographic Murphy sign noted by sonographer. Common bile duct: Diameter: 2 mm. No intrahepatic or extrahepatic biliary duct dilatation. Liver: No focal lesion identified. Liver echogenicity appears mildly increased. IMPRESSION: Cholelithiasis with mild gallbladder wall thickening and edema. The appearance is concerning for early acute cholecystitis. Increased liver echogenicity most likely indicates a degree of underlying hepatic steatosis. While no focal liver lesions are identified, it must be cautioned that the sensitivity of ultrasound for focal liver lesions is diminished in this circumstance. Electronically Signed   By: Lowella Grip III M.D.   On: 01/26/2016 14:20    Procedures  Procedures (including critical care time)  Medications Ordered in ED Medications  HYDROmorphone (DILAUDID) injection 1 mg (1 mg Intravenous Given 01/26/16 1304)  ondansetron (ZOFRAN) injection 4 mg (4 mg Intravenous Given 01/26/16 1304)  cefTRIAXone (ROCEPHIN) 2 g in dextrose 5 % 50 mL IVPB (2 g Intravenous New Bag/Given 01/26/16 1510)  HYDROmorphone (DILAUDID) injection 1 mg (1 mg Intravenous Given 01/26/16 1458)     Initial Impression / Assessment and Plan / ED Course  I have reviewed the triage vital signs and the nursing notes.  Pertinent labs & imaging results that were available during my care of the patient were reviewed by me and considered in my medical decision making (see chart for details).  Clinical Course    Afebrile nontoxic patient with epigastric pain, N/V that began last night.  Pain increased today.  CT read from last night with new addendum noting gallstones.  Pt has never had upper endoscopy.  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  US demonstrates gallbladder wall thickening, labs significant for leukocytosis.  Discussed pt with Dr  Zella Richer who accepts patient in transfer to [facility].  Pt updated and agrees with plan, advised strict NPO status.   Final Clinical Impressions(s) / ED Diagnoses   Final diagnoses:  Gallstones  Pain  Acute cholecystitis    New Prescriptions New Prescriptions   No medications on file     Clayton Bibles, PA-C 01/26/16 [place], MD 01/26/16 [phone]

## 2016-01-26 NOTE — ED Notes (Signed)
Report to patty on the floor at this time

## 2016-01-26 NOTE — ED Triage Notes (Signed)
Pt having epigastric pain, recurrent, radiating to back.  Pt has had this before.  Pt seen at [facility] last night for same and discharged.  Pain had decreased prior to discharge but increased this am.

## 2016-01-26 NOTE — H&P (Signed)
Chief Complaint:  Fourth attack of right upper quadrant pain and gallstones  History of Present Illness:  Eric Olsen is an 55 y.o. male who was transferred from [place] arriving here at 8:20 pm.  He had his fourth bout of pain starting last night and has had been moderately severe ever since.    Gallstones were seen on a CT.  He has had prostatectomy in 2013 with recurrence treated with RT this year.  Remote history of DVT which is not currently treated  I discussed lap chole with him in some detail and the plans for surgery will be discussed with him in the AM by the [facility] team.    Past Medical History:  Diagnosis Date  . Asthma    states well controlled  . Dyslipidemia   . History of DVT (deep vein thrombosis)    following achilles tendon surgery  . History of pulmonary embolus (PE)    following achilles tendon surgery   . Hypertension    cardio Dr Debara Pickett-  LOV note 2/12 on chart  . Peripheral vascular disease (HCC)    hx DVT 10 years ago following achilles tendon tear & PE-  last anticoagulation years ago  . PONV (postoperative nausea and vomiting)   . Prostate cancer [facility])    prostate  . Recurrent upper respiratory infection (URI)    recent head cold- was on augumentin- states no fever- states resolved    Past Surgical History:  Procedure Laterality Date  . CARDIAC CATHETERIZATION  03/16/2005   RCA is large & dominant, no lesions, EF 60% (Dr. Domenic Moras)  . FACIAL NERVE DECOMPRESSION     right elbow- radial   . NM MYOCAR PERF WALL MOTION  2006   persantine study - inferior wall thinning; EF 63%; low risk  . PROSTATE BIOPSY    . ROBOT ASSISTED LAPAROSCOPIC RADICAL PROSTATECTOMY  04/28/2011   Procedure: ROBOTIC ASSISTED LAPAROSCOPIC RADICAL PROSTATECTOMY;  Surgeon: Bernestine Amass, MD;  Location: [facility];  Service: Urology;  Laterality: N/A;  . TONSILLECTOMY    . TRANSTHORACIC ECHOCARDIOGRAM  2009   EF=>55%, borderline conc LVH, trace MR/TR    Current  Facility-Administered Medications  Medication Dose Route Frequency Provider Last Rate Last Dose  . 0.9 %  sodium chloride infusion   Intravenous STAT Clayton Bibles, PA-C      . HYDROmorphone (DILAUDID) injection 1 mg  1 mg Intravenous Q4H PRN Clayton Bibles, PA-C      . ondansetron [facility]) injection 4 mg  4 mg Intravenous Q8H PRN Clayton Bibles, PA-C       Review of patient's allergies indicates no known allergies. Family History  Problem Relation Age of Onset  . Heart attack Mother    Social History:   reports that he has never smoked. He has never used smokeless tobacco. He reports that he does not drink alcohol or use drugs.   REVIEW OF SYSTEMS : Negative except for see problem list  Physical Exam:   Blood pressure 129/87, pulse 101, temperature 98.4 F (36.9 C), temperature source Oral, resp. rate 18, height 5' 11"  (1.803 m), weight 96.6 kg (213 lb), SpO2 96 %. Body mass index is 29.71 kg/m.  Gen:  WDWN WM NAD  Neurological: Alert and oriented to person, place, and time. Motor and sensory function is grossly intact  Head: Normocephalic and atraumatic.  Eyes: Conjunctivae are normal. Pupils are equal, round, and reactive to light. No scleral icterus.  Neck: Normal range of  motion. Neck supple. No tracheal deviation or thyromegaly present.  Cardiovascular:  SR without murmurs or gallops.  No carotid bruits Breast:  Not examined Respiratory: Effort normal.  No respiratory distress. No chest wall tenderness. Breath sounds normal.  No wheezes, rales or rhonchi.  Abdomen:  Residual tenderness in the right upper quadrant but has pain meds on board GU:  Not examined-port sites from robotic prostatectomy Musculoskeletal: Normal range of motion. Extremities are nontender. No cyanosis, edema or clubbing noted Lymphadenopathy: No cervical, preauricular, postauricular or axillary adenopathy is present Skin: Skin is warm and dry. No rash noted. No diaphoresis. No erythema. No pallor. Pscyh: Normal  mood and affect. Behavior is normal. Judgment and thought content normal.   LABORATORY RESULTS: Results for orders placed or performed during the hospital encounter of 01/26/16 (from the past 48 hour(s))  Comprehensive metabolic panel     Status: None   Collection Time: 01/26/16 12:45 PM  Result Value Ref Range   Sodium 138 135 - 145 mmol/L   Potassium 4.1 3.5 - 5.1 mmol/L   Chloride 104 101 - 111 mmol/L   CO2 27 22 - 32 mmol/L   Glucose, Bld 98 65 - 99 mg/dL   BUN 18 6 - 20 mg/dL   Creatinine, Ser 0.85 0.61 - 1.24 mg/dL   Calcium 9.2 8.9 - 10.3 mg/dL   Total Protein 7.9 6.5 - 8.1 g/dL   Albumin 4.5 3.5 - 5.0 g/dL   AST 26 15 - 41 U/L   ALT 17 17 - 63 U/L   Alkaline Phosphatase 52 38 - 126 U/L   Total Bilirubin 0.8 0.3 - 1.2 mg/dL   GFR calc non Af Amer >60 >60 mL/min   GFR calc Af Amer >60 >60 mL/min    Comment: (NOTE) The eGFR has been calculated using the CKD EPI equation. This calculation has not been validated in all clinical situations. eGFR's persistently <60 mL/min signify possible Chronic Kidney Disease.    Anion gap 7 5 - 15  Lipase, blood     Status: None   Collection Time: 01/26/16 12:45 PM  Result Value Ref Range   Lipase 24 11 - 51 U/L  CBC with Differential     Status: Abnormal   Collection Time: 01/26/16 12:45 PM  Result Value Ref Range   WBC 10.9 (H) 4.0 - 10.5 K/uL   RBC 4.61 4.22 - 5.81 MIL/uL   Hemoglobin 15.2 13.0 - 17.0 g/dL   HCT 43.8 39.0 - 52.0 %   MCV 95.0 78.0 - 100.0 fL   MCH 33.0 26.0 - 34.0 pg   MCHC 34.7 30.0 - 36.0 g/dL   RDW 12.2 11.5 - 15.5 %   Platelets 151 150 - 400 K/uL   Neutrophils Relative % 85 %   Neutro Abs 9.2 (H) 1.7 - 7.7 K/uL   Lymphocytes Relative 5 %   Lymphs Abs 0.6 (L) 0.7 - 4.0 K/uL   Monocytes Relative 8 %   Monocytes Absolute 0.9 0.1 - 1.0 K/uL   Eosinophils Relative 2 %   Eosinophils Absolute 0.2 0.0 - 0.7 K/uL   Basophils Relative 0 %   Basophils Absolute 0.0 0.0 - 0.1 K/uL     RADIOLOGY RESULTS: Dg Abd  Acute W/chest  Result Date: 01/26/2016 CLINICAL DATA:  Epigastric abdominal pain with nausea since last night. History of hypertension, asthma and pulmonary embolism. EXAM: DG ABDOMEN ACUTE W/ 1V CHEST COMPARISON:  Abdominal CT 01/26/2016.  Radiographs 08/14/2015. FINDINGS: The heart size and mediastinal  contours are normal. The lungs are clear. There is no pleural effusion or pneumothorax. No acute osseous findings are identified. The bowel gas pattern is normal. There is no free intraperitoneal air or suspicious abdominal calcification. No acute osseous findings are seen. IMPRESSION: No active cardiopulmonary or abdominal process. Electronically Signed   By: Richardean Sale M.D.   On: 01/26/2016 13:38   Ct Renal Stone Study  Addendum Date: 01/26/2016   ADDENDUM REPORT: 01/26/2016 10:58 ADDENDUM: Hepatobiliary: No focal liver abnormality identified. Sludge versus tiny stones (less than 4 mm) noted layering within the dependent portion of the gallbladder. No gallbladder wall thickening and no biliary dilatation. Electronically Signed   By: Kerby Moors M.D.   On: 01/26/2016 10:58   Result Date: 01/26/2016 CLINICAL DATA:  Epigastric pain, nausea and vomiting. EXAM: CT ABDOMEN AND PELVIS WITHOUT CONTRAST TECHNIQUE: Multidetector CT imaging of the abdomen and pelvis was performed following the standard protocol without IV contrast. COMPARISON:  None. FINDINGS: Lower chest: No acute abnormality. Hepatobiliary: No focal liver abnormality is evident. There probably are small calculi within the elbow. No CT evidence of acute cholecystitis. No bile duct dilatation. Pancreas: Unremarkable. No pancreatic ductal dilatation or surrounding inflammatory changes. Spleen: Normal in size without focal abnormality. Adrenals/Urinary Tract: Adrenal glands are unremarkable. Kidneys are normal, without renal calculi, focal lesion, or hydronephrosis. There is mild urinary bladder mural thickening which may relate to prostate  cancer treatment. Stomach/Bowel: The stomach and small bowel are unremarkable. Appendix is normal. Colon is remarkable only for mild mural thickening in the left hemicolon without obstruction. Colitis is a possibility, although it appears more likely chronic rather than acute. Vascular/Lymphatic: No significant vascular findings are present. No enlarged abdominal or pelvic lymph nodes. Reproductive: Changes consistent with the described history of treated prostate cancer. No masses are evident. Other: No abdominal wall hernia or abnormality. No abdominopelvic ascites. Musculoskeletal: No acute or significant osseous findings. IMPRESSION: Mild left hemicolon mural thickening. This may represent colitis although it does not appear acutely inflamed and this may be chronic. There also is mild urinary bladder mural thickening of indeterminate chronicity, perhaps related to prostate cancer treatment. Electronically Signed: By: Andreas Newport M.D. On: 01/26/2016 00:51   US Abdomen Limited Ruq  Result Date: 01/26/2016 CLINICAL DATA:  Chronic right upper quadrant pain with acute episode 1 day prior EXAM: US ABDOMEN LIMITED - RIGHT UPPER QUADRANT COMPARISON:  Abdominal ultrasound September 16, 2015 and CT abdomen and pelvis January 26, 2016 FINDINGS: Gallbladder: Within the gallbladder, there is sludge as well as scattered small echogenic foci which move and shadow consistent with gallstones. Largest gallstone measures 3 mm in thickness. Gallbladder appears mildly distended with mildly edematous and thickened wall. No pericholecystic fluid evident. No sonographic Murphy sign noted by sonographer. Common bile duct: Diameter: 2 mm. No intrahepatic or extrahepatic biliary duct dilatation. Liver: No focal lesion identified. Liver echogenicity appears mildly increased. IMPRESSION: Cholelithiasis with mild gallbladder wall thickening and edema. The appearance is concerning for early acute cholecystitis. Increased liver  echogenicity most likely indicates a degree of underlying hepatic steatosis. While no focal liver lesions are identified, it must be cautioned that the sensitivity of ultrasound for focal liver lesions is diminished in this circumstance. Electronically Signed   By: Lowella Grip III M.D.   On: 01/26/2016 14:20    Problem List: Patient Active Problem List   Diagnosis Date Noted  . Acute cholecystitis 01/26/2016  . Insomnia 06/26/2014  . Dyslipidemia 06/15/2013  . History of DVT  of lower extremity 06/15/2013  . HTN (hypertension) 06/15/2013  . Malignant neoplasm of prostate ([place]) 06/15/2013    Assessment & Plan: Acute on chronic cholecystitis.  Surgical disposition per Dr. Zella Richer.      Matt B. Hassell Done, MD, [facility] [phone] beeper [phone]  01/26/2016 8:36 PM

## 2016-01-26 NOTE — ED Notes (Signed)
Report to charlie with [facility]

## 2016-01-27 ENCOUNTER — Observation Stay (HOSPITAL_COMMUNITY): Payer: 59 | Admitting: Anesthesiology

## 2016-01-27 ENCOUNTER — Encounter (HOSPITAL_COMMUNITY): Payer: Self-pay | Admitting: General Surgery

## 2016-01-27 ENCOUNTER — Encounter (HOSPITAL_COMMUNITY): Admission: EM | Disposition: A | Discharge status: Home / Self Care | Payer: Self-pay | Source: Home / Self Care

## 2016-01-27 ENCOUNTER — Observation Stay (HOSPITAL_COMMUNITY): Payer: 59

## 2016-01-27 HISTORY — PX: CHOLECYSTECTOMY: SHX55

## 2016-01-27 LAB — SURGICAL PCR SCREEN
MRSA, PCR: NEGATIVE
STAPHYLOCOCCUS AUREUS: POSITIVE — AB

## 2016-01-27 SURGERY — LAPAROSCOPIC CHOLECYSTECTOMY WITH INTRAOPERATIVE CHOLANGIOGRAM
Anesthesia: General | Site: Abdomen

## 2016-01-27 MED ORDER — HYDROMORPHONE HCL 1 MG/ML IJ SOLN
0.2500 mg | INTRAMUSCULAR | Status: DC | PRN
  Administered 2016-01-27 (×4): 0.5 mg via INTRAVENOUS

## 2016-01-27 MED ORDER — ONDANSETRON HCL 4 MG/2ML IJ SOLN
INTRAMUSCULAR | Status: AC
  Filled 2016-01-27: qty 2

## 2016-01-27 MED ORDER — PROMETHAZINE HCL 25 MG/ML IJ SOLN
6.2500 mg | INTRAMUSCULAR | Status: DC | PRN
Start: 2016-01-27 — End: 2016-01-27

## 2016-01-27 MED ORDER — HYDROMORPHONE HCL 1 MG/ML IJ SOLN
INTRAMUSCULAR | Status: AC
  Filled 2016-01-27: qty 1

## 2016-01-27 MED ORDER — PROPOFOL 10 MG/ML IV BOLUS
INTRAVENOUS | Status: AC
  Filled 2016-01-27: qty 20

## 2016-01-27 MED ORDER — PHENYLEPHRINE 40 MCG/ML (10ML) SYRINGE FOR IV PUSH (FOR BLOOD PRESSURE SUPPORT)
PREFILLED_SYRINGE | INTRAVENOUS | Status: AC
  Filled 2016-01-27: qty 10

## 2016-01-27 MED ORDER — IOPAMIDOL (ISOVUE-300) INJECTION 61%
INTRAVENOUS | Status: DC | PRN
  Administered 2016-01-27: 2 mL

## 2016-01-27 MED ORDER — IOPAMIDOL (ISOVUE-300) INJECTION 61%
INTRAVENOUS | Status: AC
  Filled 2016-01-27: qty 50

## 2016-01-27 MED ORDER — PHENYLEPHRINE HCL 10 MG/ML IJ SOLN
INTRAMUSCULAR | Status: DC | PRN
  Administered 2016-01-27: 80 ug via INTRAVENOUS
  Administered 2016-01-27: 120 ug via INTRAVENOUS

## 2016-01-27 MED ORDER — CHLORHEXIDINE GLUCONATE CLOTH 2 % EX PADS: 6.0000 | MEDICATED_PAD | Freq: Every day | CUTANEOUS | Status: DC

## 2016-01-27 MED ORDER — LACTATED RINGERS IV SOLN: INTRAVENOUS | Status: DC

## 2016-01-27 MED ORDER — ONDANSETRON HCL 4 MG/2ML IJ SOLN
INTRAMUSCULAR | Status: DC | PRN
  Administered 2016-01-27: 4 mg via INTRAVENOUS

## 2016-01-27 MED ORDER — LACTATED RINGERS IV SOLN
INTRAVENOUS | Status: DC | PRN
  Administered 2016-01-27 (×2): via INTRAVENOUS

## 2016-01-27 MED ORDER — MIDAZOLAM HCL 2 MG/2ML IJ SOLN
INTRAMUSCULAR | Status: AC
  Filled 2016-01-27: qty 2

## 2016-01-27 MED ORDER — FENTANYL CITRATE (PF) 100 MCG/2ML IJ SOLN
INTRAMUSCULAR | Status: DC | PRN
  Administered 2016-01-27: 50 ug via INTRAVENOUS

## 2016-01-27 MED ORDER — SUGAMMADEX SODIUM 200 MG/2ML IV SOLN
INTRAVENOUS | Status: AC
  Filled 2016-01-27: qty 2

## 2016-01-27 MED ORDER — PROPOFOL 10 MG/ML IV BOLUS
INTRAVENOUS | Status: DC | PRN
  Administered 2016-01-27: 150 mg via INTRAVENOUS

## 2016-01-27 MED ORDER — ROCURONIUM BROMIDE 50 MG/5ML IV SOSY
PREFILLED_SYRINGE | INTRAVENOUS | Status: AC
  Filled 2016-01-27: qty 5

## 2016-01-27 MED ORDER — SUCCINYLCHOLINE CHLORIDE 20 MG/ML IJ SOLN
INTRAMUSCULAR | Status: AC
  Filled 2016-01-27: qty 1

## 2016-01-27 MED ORDER — DEXAMETHASONE SODIUM PHOSPHATE 10 MG/ML IJ SOLN
INTRAMUSCULAR | Status: AC
  Filled 2016-01-27: qty 1

## 2016-01-27 MED ORDER — LIDOCAINE 2% (20 MG/ML) 5 ML SYRINGE
INTRAMUSCULAR | Status: AC
  Filled 2016-01-27: qty 5

## 2016-01-27 MED ORDER — DEXAMETHASONE SODIUM PHOSPHATE 10 MG/ML IJ SOLN
INTRAMUSCULAR | Status: DC | PRN
  Administered 2016-01-27: 10 mg via INTRAVENOUS

## 2016-01-27 MED ORDER — SUCCINYLCHOLINE CHLORIDE 200 MG/10ML IV SOSY
PREFILLED_SYRINGE | INTRAVENOUS | Status: DC | PRN
  Administered 2016-01-27: 100 mg via INTRAVENOUS

## 2016-01-27 MED ORDER — BUPIVACAINE HCL 0.5 % IJ SOLN
INTRAMUSCULAR | Status: DC | PRN
Start: 2016-01-27 — End: 2016-01-27
  Administered 2016-01-27: 20 mL

## 2016-01-27 MED ORDER — ROCURONIUM BROMIDE 10 MG/ML (PF) SYRINGE
PREFILLED_SYRINGE | INTRAVENOUS | Status: DC | PRN
  Administered 2016-01-27: 40 mg via INTRAVENOUS
  Administered 2016-01-27: 10 mg via INTRAVENOUS

## 2016-01-27 MED ORDER — LIDOCAINE 2% (20 MG/ML) 5 ML SYRINGE
INTRAMUSCULAR | Status: DC | PRN
  Administered 2016-01-27: 100 mg via INTRAVENOUS

## 2016-01-27 MED ORDER — FENTANYL CITRATE (PF) 100 MCG/2ML IJ SOLN
INTRAMUSCULAR | Status: AC
  Filled 2016-01-27: qty 2

## 2016-01-27 MED ORDER — BUPIVACAINE HCL (PF) 0.5 % IJ SOLN
INTRAMUSCULAR | Status: AC
  Filled 2016-01-27: qty 30

## 2016-01-27 MED ORDER — OXYCODONE HCL 5 MG PO TABS
5.0000 mg | ORAL_TABLET | ORAL | Status: DC | PRN
  Administered 2016-01-27 (×2): 5 mg via ORAL
  Administered 2016-01-28 – 2016-01-29 (×4): 10 mg via ORAL
  Filled 2016-01-27: qty 1
  Filled 2016-01-27 (×2): qty 2
  Filled 2016-01-27: qty 1
  Filled 2016-01-27 (×2): qty 2

## 2016-01-27 MED ORDER — SUGAMMADEX SODIUM 200 MG/2ML IV SOLN
INTRAVENOUS | Status: DC | PRN
  Administered 2016-01-27: 200 mg via INTRAVENOUS

## 2016-01-27 MED ORDER — 0.9 % SODIUM CHLORIDE (POUR BTL) OPTIME
TOPICAL | Status: DC | PRN
  Administered 2016-01-27: 1000 mL

## 2016-01-27 MED ORDER — MIDAZOLAM HCL 5 MG/5ML IJ SOLN
INTRAMUSCULAR | Status: DC | PRN
  Administered 2016-01-27: 2 mg via INTRAVENOUS

## 2016-01-27 MED ORDER — LACTATED RINGERS IR SOLN
Status: DC | PRN
  Administered 2016-01-27: 3000 mL

## 2016-01-27 MED ORDER — MUPIROCIN 2 % EX OINT
1.0000 "application " | TOPICAL_OINTMENT | Freq: Two times a day (BID) | CUTANEOUS | Status: DC
  Administered 2016-01-27 – 2016-01-28 (×3): 1 via NASAL
  Filled 2016-01-27: qty 22

## 2016-01-27 MED ORDER — MEPERIDINE HCL 50 MG/ML IJ SOLN: 6.2500 mg | INTRAMUSCULAR | Status: DC | PRN

## 2016-01-27 MED ORDER — MORPHINE SULFATE (PF) 2 MG/ML IV SOLN
2.0000 mg | INTRAVENOUS | Status: DC | PRN
  Administered 2016-01-27 (×2): 2 mg via INTRAVENOUS
  Filled 2016-01-27 (×3): qty 1

## 2016-01-27 MED ORDER — MIDAZOLAM HCL 2 MG/2ML IJ SOLN: 0.5000 mg | Freq: Once | INTRAMUSCULAR | Status: DC | PRN

## 2016-01-27 SURGICAL SUPPLY — 45 items
APPLIER CLIP 5 13 M/L LIGAMAX5 (MISCELLANEOUS) ×2
BENZOIN TINCTURE PRP APPL 2/3 (GAUZE/BANDAGES/DRESSINGS) ×2 IMPLANT
CABLE HIGH FREQUENCY MONO STRZ (ELECTRODE) ×2 IMPLANT
CATH REDDICK CHOLANGI 4FR 50CM (CATHETERS) ×2 IMPLANT
CHLORAPREP W/TINT 26ML (MISCELLANEOUS) ×2 IMPLANT
CLIP APPLIE 5 13 M/L LIGAMAX5 (MISCELLANEOUS) ×1 IMPLANT
COVER MAYO STAND STRL (DRAPES) ×2 IMPLANT
COVER SURGICAL LIGHT HANDLE (MISCELLANEOUS) ×2 IMPLANT
DECANTER SPIKE VIAL GLASS SM (MISCELLANEOUS) ×2 IMPLANT
DISSECTOR BLUNT TIP ENDO 5MM (MISCELLANEOUS) IMPLANT
DRAIN CHANNEL 19F RND (DRAIN) ×2 IMPLANT
DRAPE C-ARM 42X120 X-RAY (DRAPES) ×2 IMPLANT
DRSG TEGADERM 2-3/8X2-3/4 SM (GAUZE/BANDAGES/DRESSINGS) ×2 IMPLANT
DRSG TEGADERM 4X4.75 (GAUZE/BANDAGES/DRESSINGS) ×2 IMPLANT
ELECT REM PT RETURN 9FT ADLT (ELECTROSURGICAL) ×2
ELECTRODE REM PT RTRN 9FT ADLT (ELECTROSURGICAL) ×1 IMPLANT
ENDOLOOP SUT PDS II  0 18 (SUTURE)
ENDOLOOP SUT PDS II 0 18 (SUTURE) IMPLANT
GAUZE SPONGE 2X2 8PLY STRL LF (GAUZE/BANDAGES/DRESSINGS) ×1 IMPLANT
GLOVE ECLIPSE 8.0 STRL XLNG CF (GLOVE) ×2 IMPLANT
GLOVE INDICATOR 8.0 STRL GRN (GLOVE) ×2 IMPLANT
GOWN STRL REUS W/TWL XL LVL3 (GOWN DISPOSABLE) ×6 IMPLANT
HEMOSTAT SNOW SURGICEL 2X4 (HEMOSTASIS) ×2 IMPLANT
IRRIG SUCT STRYKERFLOW 2 WTIP (MISCELLANEOUS) ×2
IRRIGATION SUCT STRKRFLW 2 WTP (MISCELLANEOUS) ×1 IMPLANT
IV CATH 14GX2 1/4 (CATHETERS) ×2 IMPLANT
KIT BASIN OR (CUSTOM PROCEDURE TRAY) ×2 IMPLANT
POUCH RETRIEVAL ECOSAC 10 (ENDOMECHANICALS) ×1 IMPLANT
POUCH RETRIEVAL ECOSAC 10MM (ENDOMECHANICALS) ×1
POUCH SPECIMEN RETRIEVAL 10MM (ENDOMECHANICALS) ×2 IMPLANT
SCISSORS LAP 5X35 DISP (ENDOMECHANICALS) ×2 IMPLANT
SET TUBE IRRIG SUCTION NO TIP (IRRIGATION / IRRIGATOR) ×2 IMPLANT
SLEEVE XCEL OPT CAN 5 100 (ENDOMECHANICALS) ×4 IMPLANT
SPONGE DRAIN TRACH 4X4 STRL 2S (GAUZE/BANDAGES/DRESSINGS) ×2 IMPLANT
SPONGE GAUZE 2X2 STER 10/PKG (GAUZE/BANDAGES/DRESSINGS) ×1
STRIP CLOSURE SKIN 1/2X4 (GAUZE/BANDAGES/DRESSINGS) ×2 IMPLANT
SUT MNCRL AB 4-0 PS2 18 (SUTURE) ×4 IMPLANT
SUT NYLON 3 0 (SUTURE) ×2 IMPLANT
TOWEL OR 17X26 10 PK STRL BLUE (TOWEL DISPOSABLE) ×2 IMPLANT
TOWEL OR NON WOVEN STRL DISP B (DISPOSABLE) IMPLANT
TRAY LAPAROSCOPIC (CUSTOM PROCEDURE TRAY) ×2 IMPLANT
TROCAR BLADELESS OPT 5 100 (ENDOMECHANICALS) ×2 IMPLANT
TROCAR XCEL BLUNT TIP 100MML (ENDOMECHANICALS) ×2 IMPLANT
TROCAR XCEL NON-BLD 11X100MML (ENDOMECHANICALS) IMPLANT
TUBING INSUF HEATED (TUBING) ×2 IMPLANT

## 2016-01-27 NOTE — Progress Notes (Signed)
Assessment Active Problems:   Acute cholecystitis, cholelithiasis-comfortable this AM  Plan:  Laparoscopic, possible open cholecystectomy with IOC. I have explained the procedure, risks, and aftercare of cholecystectomy.  Risks include but are not limited to bleeding, infection, wound problems, anesthesia, diarrhea, bile leak, injury to common bile duct/liver/intestine.  He seems to understand and agrees to proceed.    LOS: 1 day        Subjective: No severe pain this AM.  Wife in room.  Objective: Vital signs in last 24 hours: Temp:  [98.4 F (36.9 C)-98.7 F (37.1 C)] 98.7 F (37.1 C) (10/30 2100) Pulse Rate:  [77-101] 98 (10/30 2100) Resp:  [14-20] 16 (10/30 2100) BP: (115-136)/(68-95) 115/68 (10/30 2100) SpO2:  [91 %-98 %] 95 % (10/30 2100) Weight:  [96.6 kg (213 lb)] 96.6 kg (213 lb) (10/30 1148) Last BM Date: 01/25/16  Intake/Output from previous day: 10/30 0701 - 10/31 0700 In: 2503.3 [I.V.:813.3; IV Piggyback:1690] Out: -  Intake/Output this shift: No intake/output data recorded.  PE: General- In NAD Abdomen-soft, no tenderness, no mass  Lab Results:   Recent Labs  01/26/16 1245 01/26/16 2118  WBC 10.9* 11.1*  HGB 15.2 14.3  HCT 43.8 40.9  PLT 151 145*   BMET  Recent Labs  01/25/16 2243 01/26/16 1245 01/26/16 2118  NA 141 138  --   K 3.6 4.1  --   CL 106 104  --   CO2 29 27  --   GLUCOSE 107* 98  --   BUN 12 18  --   CREATININE 0.95 0.85 0.96  CALCIUM 9.3 9.2  --    PT/INR No results for input(s): LABPROT, INR in the last 72 hours. Comprehensive Metabolic Panel:    Component Value Date/Time   NA 138 01/26/2016 1245   NA 141 01/25/2016 2243   K 4.1 01/26/2016 1245   K 3.6 01/25/2016 2243   CL 104 01/26/2016 1245   CL 106 01/25/2016 2243   CO2 27 01/26/2016 1245   CO2 29 01/25/2016 2243   BUN 18 01/26/2016 1245   BUN 12 01/25/2016 2243   CREATININE 0.96 01/26/2016 2118   CREATININE 0.85 01/26/2016 1245   CREATININE 0.98  08/06/2015 0830   GLUCOSE 98 01/26/2016 1245   GLUCOSE 107 (H) 01/25/2016 2243   CALCIUM 9.2 01/26/2016 1245   CALCIUM 9.3 01/25/2016 2243   AST 26 01/26/2016 1245   AST 24 01/25/2016 2243   ALT 17 01/26/2016 1245   ALT 15 (L) 01/25/2016 2243   ALKPHOS 52 01/26/2016 1245   ALKPHOS 49 01/25/2016 2243   BILITOT 0.8 01/26/2016 1245   BILITOT 0.3 01/25/2016 2243   PROT 7.9 01/26/2016 1245   PROT 6.9 01/25/2016 2243   ALBUMIN 4.5 01/26/2016 1245   ALBUMIN 3.9 01/25/2016 2243     Studies/Results: Dg Abd Acute W/chest  Result Date: 01/26/2016 CLINICAL DATA:  Epigastric abdominal pain with nausea since last night. History of hypertension, asthma and pulmonary embolism. EXAM: DG ABDOMEN ACUTE W/ 1V CHEST COMPARISON:  Abdominal CT 01/26/2016.  Radiographs 08/14/2015. FINDINGS: The heart size and mediastinal contours are normal. The lungs are clear. There is no pleural effusion or pneumothorax. No acute osseous findings are identified. The bowel gas pattern is normal. There is no free intraperitoneal air or suspicious abdominal calcification. No acute osseous findings are seen. IMPRESSION: No active cardiopulmonary or abdominal process. Electronically Signed   By: Richardean Sale M.D.   On: 01/26/2016 13:38   Ct Renal Stone Study  Addendum Date: 01/26/2016   ADDENDUM REPORT: 01/26/2016 10:58 ADDENDUM: Hepatobiliary: No focal liver abnormality identified. Sludge versus tiny stones (less than 4 mm) noted layering within the dependent portion of the gallbladder. No gallbladder wall thickening and no biliary dilatation. Electronically Signed   By: Kerby Moors M.D.   On: 01/26/2016 10:58   Result Date: 01/26/2016 CLINICAL DATA:  Epigastric pain, nausea and vomiting. EXAM: CT ABDOMEN AND PELVIS WITHOUT CONTRAST TECHNIQUE: Multidetector CT imaging of the abdomen and pelvis was performed following the standard protocol without IV contrast. COMPARISON:  None. FINDINGS: Lower chest: No acute  abnormality. Hepatobiliary: No focal liver abnormality is evident. There probably are small calculi within the elbow. No CT evidence of acute cholecystitis. No bile duct dilatation. Pancreas: Unremarkable. No pancreatic ductal dilatation or surrounding inflammatory changes. Spleen: Normal in size without focal abnormality. Adrenals/Urinary Tract: Adrenal glands are unremarkable. Kidneys are normal, without renal calculi, focal lesion, or hydronephrosis. There is mild urinary bladder mural thickening which may relate to prostate cancer treatment. Stomach/Bowel: The stomach and small bowel are unremarkable. Appendix is normal. Colon is remarkable only for mild mural thickening in the left hemicolon without obstruction. Colitis is a possibility, although it appears more likely chronic rather than acute. Vascular/Lymphatic: No significant vascular findings are present. No enlarged abdominal or pelvic lymph nodes. Reproductive: Changes consistent with the described history of treated prostate cancer. No masses are evident. Other: No abdominal wall hernia or abnormality. No abdominopelvic ascites. Musculoskeletal: No acute or significant osseous findings. IMPRESSION: Mild left hemicolon mural thickening. This may represent colitis although it does not appear acutely inflamed and this may be chronic. There also is mild urinary bladder mural thickening of indeterminate chronicity, perhaps related to prostate cancer treatment. Electronically Signed: By: Andreas Newport M.D. On: 01/26/2016 00:51   US Abdomen Limited Ruq  Result Date: 01/26/2016 CLINICAL DATA:  Chronic right upper quadrant pain with acute episode 1 day prior EXAM: US ABDOMEN LIMITED - RIGHT UPPER QUADRANT COMPARISON:  Abdominal ultrasound September 16, 2015 and CT abdomen and pelvis January 26, 2016 FINDINGS: Gallbladder: Within the gallbladder, there is sludge as well as scattered small echogenic foci which move and shadow consistent with gallstones.  Largest gallstone measures 3 mm in thickness. Gallbladder appears mildly distended with mildly edematous and thickened wall. No pericholecystic fluid evident. No sonographic Murphy sign noted by sonographer. Common bile duct: Diameter: 2 mm. No intrahepatic or extrahepatic biliary duct dilatation. Liver: No focal lesion identified. Liver echogenicity appears mildly increased. IMPRESSION: Cholelithiasis with mild gallbladder wall thickening and edema. The appearance is concerning for early acute cholecystitis. Increased liver echogenicity most likely indicates a degree of underlying hepatic steatosis. While no focal liver lesions are identified, it must be cautioned that the sensitivity of ultrasound for focal liver lesions is diminished in this circumstance. Electronically Signed   By: Lowella Grip III M.D.   On: 01/26/2016 14:20    Anti-infectives: Anti-infectives    Start     Dose/Rate Route Frequency Ordered Stop   01/26/16 2130  piperacillin-tazobactam (ZOSYN) IVPB 3.375 g     3.375 g 12.5 mL/hr over 240 Minutes Intravenous Every 8 hours 01/26/16 2059     01/26/16 1500  cefTRIAXone (ROCEPHIN) 2 g in dextrose 5 % 50 mL IVPB     2 g 100 mL/hr over 30 Minutes Intravenous  Once 01/26/16 1453 01/26/16 1650       Chyan Carnero J 01/27/2016

## 2016-01-27 NOTE — Transfer of Care (Signed)
Immediate Anesthesia Transfer of Care Note  Patient: Eric Olsen  Procedure(s) Performed: Procedure(s): LAPAROSCOPIC CHOLECYSTECTOMY WITH INTRAOPERATIVE CHOLANGIOGRAM (N/A)  Patient Location: PACU  Anesthesia Type:General  Level of Consciousness: sedated  Airway & Oxygen Therapy: Patient Spontanous Breathing and Patient connected to face mask oxygen  Post-op Assessment: Report given to RN and Post -op Vital signs reviewed and stable  Post vital signs: Reviewed and stable  Last Vitals:  Vitals:   01/26/16 2100 01/27/16 0919  BP: 115/68 118/77  Pulse: 98 85  Resp: 16 16  Temp: 37.1 C 36.8 C    Last Pain:  Vitals:   01/27/16 0919  TempSrc: Oral  PainSc:       Patients Stated Pain Goal: 3 ([id number])  Complications: No apparent anesthesia complications

## 2016-01-27 NOTE — Anesthesia Procedure Notes (Signed)
Procedure Name: Intubation Date/Time: 01/27/2016 10:06 AM Performed by: Lind Covert Pre-anesthesia Checklist: Patient identified, Emergency Drugs available, Suction available, Patient being monitored and Timeout performed Patient Re-evaluated:Patient Re-evaluated prior to inductionOxygen Delivery Method: Circle system utilized Preoxygenation: Pre-oxygenation with 100% oxygen Intubation Type: IV induction, Rapid sequence and Cricoid Pressure applied Laryngoscope Size: Mac and 4 Grade View: Grade I Tube type: Oral Tube size: 7.5 mm Number of attempts: 1 Airway Equipment and Method: Stylet Placement Confirmation: ETT inserted through vocal cords under direct vision,  positive ETCO2 and breath sounds checked- equal and bilateral Secured at: 22 cm Tube secured with: Tape Dental Injury: Teeth and Oropharynx as per pre-operative assessment

## 2016-01-27 NOTE — Discharge Instructions (Addendum)
[facility] ______[facility] LAPAROSCOPIC CHOLECYSTECTOMY SURGERY: POST OP INSTRUCTIONS Always review your discharge instruction sheet given to you by the facility where your surgery was performed. IF YOU HAVE DISABILITY OR FAMILY LEAVE FORMS, YOU MUST BRING THEM TO THE OFFICE FOR PROCESSING.   DO NOT GIVE THEM TO YOUR DOCTOR.  1. A prescription for pain medication may be given to you upon discharge.  Take your pain medication as prescribed, if needed.  If narcotic pain medicine is not needed, then you may take acetaminophen (Tylenol) or ibuprofen (Advil) as needed. 2. Take your usually prescribed medications unless otherwise directed. 3. If you need a refill on your pain medication, please contact your pharmacy.  They will contact our office to request authorization. Prescriptions will not be filled after 5pm or on week-ends. 4. You should follow a light diet the first few days after arrival home, such as soup and crackers, etc.  Be sure to include lots of fluids daily.  May start lowfat, solid foods 2 days after the surgery. 5. Most patients will experience some swelling and bruising in the area of the incisions.  Ice packs will help.  Swelling and bruising can take several days to resolve.  6. It is common to experience some constipation if taking pain medication after surgery.  Increasing fluid intake and taking a stool softener (such as Colace) will usually help or prevent this problem from occurring.  A mild laxative (Milk of Magnesia or Miralax) should be taken according to package instructions if there are no bowel movements after 48 hours. 7. Unless discharge instructions indicate otherwise, you may remove your bandages &2 hours after surgery.  You may shower the day after surgery.  You may have steri-strips (small skin tapes) in place directly over the incision.  These strips should be left on the skin until they fall off.  If your surgeon used skin glue on the incision, you may  shower in 24 hours.  The glue will flake off over the next 2-3 weeks.  Any sutures or staples will be removed at the office during your follow-up visit. 8. ACTIVITIES:  You may resume regular (light) daily activities beginning the next day--such as daily self-care, walking, climbing stairs--gradually increasing activities as tolerated.  You may have sexual intercourse when it is comfortable.  Refrain from any heavy lifting or straining for two weeks.  Do not lift anything over 10 pounds during that time.  a. You may drive when you are no longer taking prescription pain medication, you can comfortably wear a seatbelt, and you can safely maneuver your car and apply brakes. b. RETURN TO WORK:  1- 2 weeks if you are pain-free.________________________________________________________ 9. You should see your doctor in the office for a follow-up appointment approximately 2-3 weeks after your surgery.  Make sure that you call for this appointment within a day or two after you arrive home to insure a convenient appointment time. 10. OTHER INSTRUCTIONS: __________________________________________________________________________________________________________________________ __________________________________________________________________________________________________________________________ WHEN TO CALL YOUR DOCTOR: 1. Fever over 101.0 2. Inability to urinate 3. Continued bleeding from incision. 4. Increased pain, redness, or drainage from the incision. 5. Increasing abdominal pain  The clinic staff is available to answer your questions during regular business hours.  Please dont hesitate to call and ask to speak to one of the nurses for clinical concerns.  If you have a medical emergency, go to the nearest emergency room or call 911.  A surgeon from [facility] is always on call at the  hospital. [address], [place], [place], [place]  [address] ? [address], [place], [place] [phone] ? [phone] ? FAX [phone] Web site: [email or web address]

## 2016-01-27 NOTE — Op Note (Signed)
OPERATIVE NOTE-LAPAROSCOPIC CHOLECYSTECTOMY  Preoperative diagnosis:  Acute calculus cholecystitis  Postoperative diagnosis:  Same  Procedure: Laparoscopic cholecystectomy with cholangiogram.  Surgeon: Jackolyn Confer, M.D.  Asst.:  Excell Seltzer M.D.  Anesthesia: General  Indication:   This is a 55 year old male admitted last night with acute calculus cholecystitis. He's been given intravenous antibiotics. He is now brought to the operating room for urgent laparoscopic possible open cholecystectomy. Given his history of thromboembolic disease, he's been given preoperative subcutaneous heparin.  Technique: He/ was brought to the operating room, placed supine on the operating table, and a general anesthetic was administered. The hair on the abdominal wall was clipped as was necessary. The abdominal wall was then sterilely prepped and draped.  A timeout was performed.    Local anesthetic (Marcaine) was infiltrated in the subumbilical region. A small subumbilical incision was made through the skin, subcutaneous tissue, fascia, and peritoneum entering the peritoneal cavity under direct vision. A pursestring suture of 0 Vicryl was placed around the edges of the fascia. A Hassan trocar was introduced into the peritoneal cavity and a pneumoperitoneum was created by insufflation of carbon dioxide gas. The laparoscope was introduced into the trocar and no underlying bleeding or organ injury was noted. He was then placed in the reverse Trendelenburg position with the right side tilted slightly up.  Three 5 mm trocars were then placed into the abdominal cavity under laparoscopic vision. One in the epigastric area, and 2 in the right upper quadrant area. The gallbladder was visualized.  There were omental adhesions to the gallbladder that were separated bluntly exposing an acutely inflamed and edematous gallbladder. When the fundus was grasped, there was some leakage of pus from the gallbladder.  The  fundus was retracted toward the right shoulder.  The infundibulum was mobilized with dissection close to the gallbladder and retracted laterally. The cystic duct was identified and a window was created around it. An anterior branch of the cystic artery was also identified adjacent to the cystic duct and a window was created around it.  It was clipped and divided. The critical view was achieved. A clip was placed at the neck of the gallbladder. A small incision was made in the cystic duct. A cholangiocatheter was introduced through the anterior abdominal wall and placed in the cystic duct. A intraoperative cholangiogram was then performed.  Under real-time fluoroscopy, dilute contrast was injected into the cystic duct.  The common hepatic duct, the right and left hepatic ducts, and the common duct were all visualized. Contrast drained into the duodenum without obvious evidence of any obstructing ductal lesion. The final report is pending the Radiologist's interpretation.  The cholangiocatheter was removed, the cystic duct was clipped 3 times on the biliary side, and then the cystic duct was divided sharply. No bile leak was noted from the cystic duct stump.  A posterior branch of the cystic artery was then isolated, clipped and divided. Following this the gallbladder was dissected free from the liver using electrocautery. The gallbladder was then placed in a retrieval bag and removed from the abdominal cavity through the subumbilical incision.  The gallbladder fossa was inspected, irrigated, and bleeding was controlled with electrocautery and Surgicel. Inspection showed that hemostasis was adequate and there was no evidence of bile leak.  The irrigation fluid was evacuated as much as possible. A 19 Blake drain was then introduced into the abdominal cavity and placed in the gallbladder fossa. The drain was brought out a 5 mm right-sided trocar and anchored to  the skin with nylon suture.  The subumbilical  trocar was removed and the fascial defect was closed by tightening and tying down the pursestring suture under laparoscopic vision.  The remaining trocars were removed and the pneumoperitoneum was released. The skin incisions were closed with 4-0 Monocryl subcuticular stitches. Steri-Strips and sterile dressings were applied.  The procedure was well-tolerated without any apparent complications. He was taken to the recovery room in satisfactory condition.

## 2016-01-27 NOTE — Anesthesia Preprocedure Evaluation (Signed)
Anesthesia Evaluation   Patient awake    Reviewed: Allergy & Precautions, NPO status , Patient's Chart, lab work & pertinent test results  History of Anesthesia Complications Negative for: history of anesthetic complications  Airway Mallampati: II  TM Distance: >3 FB Neck ROM: Full    Dental  (+) Caps, Dental Advisory Given   Pulmonary asthma ,    breath sounds clear to auscultation       Cardiovascular hypertension, Pt. on medications (-) angina+ DVT   Rhythm:Regular Rate:Normal  '09 ECHO: EF=>55%, borderline conc LVH, trace MR/TR '06 Cath: normal coronaries   Neuro/Psych    GI/Hepatic Neg liver ROS, GERD  Medicated,N/v with acute chole   Endo/Other  negative endocrine ROS  Renal/GU negative Renal ROS     Musculoskeletal   Abdominal   Peds  Hematology   Anesthesia Other Findings   Reproductive/Obstetrics                             Anesthesia Physical Anesthesia Plan  ASA: II  Anesthesia Plan: General   Post-op Pain Management:    Induction: Intravenous and Rapid sequence  Airway Management Planned: Oral ETT  Additional Equipment:   Intra-op Plan:   Post-operative Plan: Extubation in OR  Informed Consent: I have reviewed the patients History and Physical, chart, labs and discussed the procedure including the risks, benefits and alternatives for the proposed anesthesia with the patient or authorized representative who has indicated his/her understanding and acceptance.   Dental advisory given  Plan Discussed with: CRNA and Surgeon  Anesthesia Plan Comments: (Plan routine monitors, GETA)        Anesthesia Quick Evaluation

## 2016-01-27 NOTE — Progress Notes (Signed)
Eric Olsen is a 55 y.o. male patient admitted from [facility] from [facility] awake, alert - oriented  X 4 - no acute distress noted.  VSS - Blood pressure 115/68, pulse 98, temperature 98.7 F (37.1 C), temperature source Oral, resp. rate 16, height 5\' 11"  (1.803 m), weight 96.6 kg (213 lb), SpO2 95 %.    IV in place, occlusive dsg intact without redness.  Orientation to room, and floor completed with information packet given to patient/family.  Patient declined safety video at this time.  Admission INP armband ID verified with patient/family, and in place.   SR up x 2, fall assessment complete, with patient and family able to verbalize understanding of risk associated with falls, and verbalized understanding to call nsg before up out of bed.  Call light within reach, patient able to voice, and demonstrate understanding.  Skin, clean-dry- intact without evidence of bruising, or skin tears.   No evidence of skin break down noted on exam.     Will cont to eval and treat per MD orders.  Marcy Salvo, RN 01/27/2016 12:53 AM

## 2016-01-27 NOTE — Anesthesia Postprocedure Evaluation (Signed)
Anesthesia Post Note  Patient: Eric Olsen  Procedure(s) Performed: Procedure(s) (LRB): LAPAROSCOPIC CHOLECYSTECTOMY WITH INTRAOPERATIVE CHOLANGIOGRAM (N/A)  Patient location during evaluation: PACU Anesthesia Type: General Level of consciousness: awake and alert, oriented and patient cooperative Pain management: pain level controlled Vital Signs Assessment: post-procedure vital signs reviewed and stable Respiratory status: spontaneous breathing, nonlabored ventilation, respiratory function stable and patient connected to nasal cannula oxygen Cardiovascular status: blood pressure returned to baseline and stable Postop Assessment: no signs of nausea or vomiting Anesthetic complications: no    Last Vitals:  Vitals:   01/27/16 1300 01/27/16 1332  BP: 115/78 128/78  Pulse: 97 91  Resp: 13 12  Temp: 37.4 C 37.7 C    Last Pain:  Vitals:   01/27/16 1300  TempSrc:   PainSc: 4                  Ralston Venus,E. Aston Lawhorn

## 2016-01-28 LAB — CBC
HEMATOCRIT: 38.7 % — AB (ref 39.0–52.0)
Hemoglobin: 13.1 g/dL (ref 13.0–17.0)
MCH: 32.8 pg (ref 26.0–34.0)
MCHC: 33.9 g/dL (ref 30.0–36.0)
MCV: 96.8 fL (ref 78.0–100.0)
Platelets: 150 10*3/uL (ref 150–400)
RBC: 4 MIL/uL — ABNORMAL LOW (ref 4.22–5.81)
RDW: 12.2 % (ref 11.5–15.5)
WBC: 10.4 10*3/uL (ref 4.0–10.5)

## 2016-01-28 MED ORDER — HEPARIN SODIUM (PORCINE) 5000 UNIT/ML IJ SOLN
5000.0000 [IU] | Freq: Three times a day (TID) | INTRAMUSCULAR | Status: DC
  Administered 2016-01-28 – 2016-01-29 (×3): 5000 [IU] via SUBCUTANEOUS
  Filled 2016-01-28 (×4): qty 1

## 2016-01-28 NOTE — Progress Notes (Signed)
Assessment Principal Problem:   Acute cholecystitis s/p laparoscopic cholecystectomy with IOC 01/27/16-doing well thus far; we discussed the operative findings  Active Problems: HTN-under good control H/O DVT and PE    Plan:  [place] Heparin.  Advance diet as tolerated.  Continue IV abxs.  Mobilize.  Drain care teaching.   LOS: 1 day     1 Day Post-Op  Subjective: Sore in periumbilical area.  Objective: Vital signs in last 24 hours: Temp:  [97.5 F (36.4 C)-99.9 F (37.7 C)] 98.1 F (36.7 C) (11/01 0526) Pulse Rate:  [77-102] 77 (11/01 0526) Resp:  [12-23] 16 (11/01 0526) BP: (102-138)/(70-85) 109/76 (11/01 0526) SpO2:  [95 %-100 %] 98 % (11/01 0526) Last BM Date: 01/25/16  Intake/Output from previous day: 10/31 0701 - 11/01 0700 In: 4660 [P.O.:360; I.V.:4100; IV Piggyback:200] Out: 3145 [Urine:2900; Drains:225; Blood:20] Intake/Output this shift: No intake/output data recorded.  PE: General- In NAD Abdomen-soft, dressings dry, thin serosanguinous drain output  Lab Results:   Recent Labs  01/26/16 2118 01/28/16 0448  WBC 11.1* 10.4  HGB 14.3 13.1  HCT 40.9 38.7*  PLT 145* 150   BMET  Recent Labs  01/25/16 2243 01/26/16 1245 01/26/16 2118  NA 141 138  --   K 3.6 4.1  --   CL 106 104  --   CO2 29 27  --   GLUCOSE 107* 98  --   BUN 12 18  --   CREATININE 0.95 0.85 0.96  CALCIUM 9.3 9.2  --    PT/INR No results for input(s): LABPROT, INR in the last 72 hours. Comprehensive Metabolic Panel:    Component Value Date/Time   NA 138 01/26/2016 1245   NA 141 01/25/2016 2243   K 4.1 01/26/2016 1245   K 3.6 01/25/2016 2243   CL 104 01/26/2016 1245   CL 106 01/25/2016 2243   CO2 27 01/26/2016 1245   CO2 29 01/25/2016 2243   BUN 18 01/26/2016 1245   BUN 12 01/25/2016 2243   CREATININE 0.96 01/26/2016 2118   CREATININE 0.85 01/26/2016 1245   CREATININE 0.98 08/06/2015 0830   GLUCOSE 98 01/26/2016 1245   GLUCOSE 107 (H) 01/25/2016 2243   CALCIUM 9.2  01/26/2016 1245   CALCIUM 9.3 01/25/2016 2243   AST 26 01/26/2016 1245   AST 24 01/25/2016 2243   ALT 17 01/26/2016 1245   ALT 15 (L) 01/25/2016 2243   ALKPHOS 52 01/26/2016 1245   ALKPHOS 49 01/25/2016 2243   BILITOT 0.8 01/26/2016 1245   BILITOT 0.3 01/25/2016 2243   PROT 7.9 01/26/2016 1245   PROT 6.9 01/25/2016 2243   ALBUMIN 4.5 01/26/2016 1245   ALBUMIN 3.9 01/25/2016 2243     Studies/Results: Dg Cholangiogram Operative  Result Date: 01/27/2016 CLINICAL DATA:  Cholecystectomy. EXAM: INTRAOPERATIVE CHOLANGIOGRAM TECHNIQUE: Cholangiographic images from the C-arm fluoroscopic device were submitted for interpretation post-operatively. Please see the procedural report for the amount of contrast and the fluoroscopy time utilized. COMPARISON:  CT 01/26/2016 FINDINGS: Extrahepatic biliary system is patent without large filling defects or obstruction. Contrast drains into the duodenum. Minimal filling of the central intrahepatic ducts. IMPRESSION: Patent biliary system. Electronically Signed   By: Markus Daft M.D.   On: 01/27/2016 11:01   Dg Abd Acute W/chest  Result Date: 01/26/2016 CLINICAL DATA:  Epigastric abdominal pain with nausea since last night. History of hypertension, asthma and pulmonary embolism. EXAM: DG ABDOMEN ACUTE W/ 1V CHEST COMPARISON:  Abdominal CT 01/26/2016.  Radiographs 08/14/2015. FINDINGS: The heart size  and mediastinal contours are normal. The lungs are clear. There is no pleural effusion or pneumothorax. No acute osseous findings are identified. The bowel gas pattern is normal. There is no free intraperitoneal air or suspicious abdominal calcification. No acute osseous findings are seen. IMPRESSION: No active cardiopulmonary or abdominal process. Electronically Signed   By: Richardean Sale M.D.   On: 01/26/2016 13:38   US Abdomen Limited Ruq  Result Date: 01/26/2016 CLINICAL DATA:  Chronic right upper quadrant pain with acute episode 1 day prior EXAM: US ABDOMEN  LIMITED - RIGHT UPPER QUADRANT COMPARISON:  Abdominal ultrasound September 16, 2015 and CT abdomen and pelvis January 26, 2016 FINDINGS: Gallbladder: Within the gallbladder, there is sludge as well as scattered small echogenic foci which move and shadow consistent with gallstones. Largest gallstone measures 3 mm in thickness. Gallbladder appears mildly distended with mildly edematous and thickened wall. No pericholecystic fluid evident. No sonographic Murphy sign noted by sonographer. Common bile duct: Diameter: 2 mm. No intrahepatic or extrahepatic biliary duct dilatation. Liver: No focal lesion identified. Liver echogenicity appears mildly increased. IMPRESSION: Cholelithiasis with mild gallbladder wall thickening and edema. The appearance is concerning for early acute cholecystitis. Increased liver echogenicity most likely indicates a degree of underlying hepatic steatosis. While no focal liver lesions are identified, it must be cautioned that the sensitivity of ultrasound for focal liver lesions is diminished in this circumstance. Electronically Signed   By: Lowella Grip III M.D.   On: 01/26/2016 14:20    Anti-infectives: Anti-infectives    Start     Dose/Rate Route Frequency Ordered Stop   01/26/16 2130  piperacillin-tazobactam (ZOSYN) IVPB 3.375 g     3.375 g 12.5 mL/hr over 240 Minutes Intravenous Every 8 hours 01/26/16 2059     01/26/16 1500  cefTRIAXone (ROCEPHIN) 2 g in dextrose 5 % 50 mL IVPB     2 g 100 mL/hr over 30 Minutes Intravenous  Once 01/26/16 1453 01/26/16 1650       Boston Cookson J 01/28/2016

## 2016-01-28 NOTE — Progress Notes (Signed)
Instructions began on JP drain. Patient was given a step-by-step tutorial on how to empty and place bulb back to suction. Patient was instructed on how to write down the amount of fluid from each emptying process. Patient verbalized understanding. Will continue to work with patient on education.

## 2016-01-29 ENCOUNTER — Encounter (INDEPENDENT_AMBULATORY_CARE_PROVIDER_SITE_OTHER): Payer: Self-pay | Admitting: Physician Assistant

## 2016-01-29 MED ORDER — AMOXICILLIN-POT CLAVULANATE 875-125 MG PO TABS: 1.0000 | ORAL_TABLET | Freq: Two times a day (BID) | ORAL | 0 refills | Status: DC

## 2016-01-29 MED ORDER — OXYCODONE HCL 5 MG PO TABS: 5.0000 mg | ORAL_TABLET | ORAL | 0 refills | Status: DC | PRN

## 2016-01-29 MED ORDER — SACCHAROMYCES BOULARDII 250 MG PO CAPS: 250.0000 mg | ORAL_CAPSULE | Freq: Two times a day (BID) | ORAL | Status: DC

## 2016-01-29 NOTE — Progress Notes (Signed)
Discharge instructions discussed with patient until no further questions ask.  Dressing changed on jp drain. Wife and patient shown how to change dressing. Pt  able to demonstrate how to empty and recharge drain.

## 2016-01-29 NOTE — Discharge Summary (Signed)
[facility] Discharge Summary   Patient ID: Eric Olsen MRN: [id number] DOB/AGE: 54-May-1962 54 y.o.  Admit date: 01/26/2016 Discharge date: 01/29/2016  Admitting Diagnosis: Acute cholecystitis Gallstones  Discharge Diagnosis Patient Active Problem List   Diagnosis Date Noted  . Acute cholecystitis s/p laparoscopic cholecystectomy with [facility] 01/27/16 01/26/2016  . Cholecystitis 01/26/2016  . Insomnia 06/26/2014  . Dyslipidemia 06/15/2013  . History of DVT of lower extremity 06/15/2013  . HTN (hypertension) 06/15/2013  . Malignant neoplasm of prostate ([place]) 06/15/2013    Consultants None  Imaging: CT renal stone study 01/26/16: Mild left hemicolon mural thickening. This may represent colitis although it does not appear acutely inflamed and this may be chronic. There also is mild urinary bladder mural thickening of indeterminate chronicity, perhaps related to prostate cancer Treatment.  Hepatobiliary: No focal liver abnormality identified. Sludge versus tiny stones (less than 4 mm) noted layering within the dependent portion of the gallbladder. No gallbladder wall thickening and no biliary dilatation.  US abdomen limited RUQ 01/26/16: Cholelithiasis with mild gallbladder wall thickening and edema. The appearance is concerning for early acute cholecystitis. Increased liver echogenicity most likely indicates a degree of underlying hepatic steatosis. While no focal liver lesions are identified, it must be cautioned that the sensitivity of ultrasound for focal liver lesions is diminished in this circumstance.  DG cholangiogram operative 01/27/16: Patent biliary system.  Procedures Dr. Zella Richer (01/27/16) - Laparoscopic cholecystectomy with cholangiogram  Hospital Course:  Eric Olsen is a 55yo male who was transferred from [place] to [facility] 01/26/16 with his fourth attack of RUQ pain and gallstones.  Gallstones were seen on a CT, and patient was  found to have acute on chronic cholecystitis.  Patient was admitted and underwent procedure listed above.  Tolerated procedure well and was transferred to the floor. He was on zosyn 3.375 q8 hours during admission. Diet was advanced as tolerated.  On POD2 the patient was voiding well, tolerating diet, ambulating well, pain well controlled, vital signs stable, incisions c/d/i and felt stable for discharge home.  He will keep the drain in place until output decreases, and follow-up with one of our nurses next week to check his drain. He will be on augmentin for 5 days. Patient will follow up in our office with Dr. Zella Richer in 2-3 weeks and knows to call with questions or concerns.    Physical Exam: Gen:  Alert, NAD, pleasant Card:  RRR Pulm:  CTAB Abd: Soft, ND, appropriately tender, +BS, incisions C/D/I, drain with serosanguinous drainage    Medication List    STOP taking these medications   ciprofloxacin 500 MG tablet Commonly known as:  CIPRO   metroNIDAZOLE 500 MG tablet Commonly known as:  FLAGYL     TAKE these medications   albuterol 108 (90 Base) MCG/ACT inhaler Commonly known as:  PROVENTIL HFA;VENTOLIN HFA Inhale 2 puffs into the lungs every 6 (six) hours as needed for wheezing or shortness of breath.   amoxicillin-clavulanate 875-125 MG tablet Commonly known as:  AUGMENTIN Take 1 tablet by mouth 2 (two) times daily.   aspirin 81 MG tablet Take 81 mg by mouth daily.   Garlic 123XX123 MG Caps Take 1 capsule by mouth daily.   lisinopril 20 MG tablet Commonly known as:  PRINIVIL,ZESTRIL Take 1 tablet (20 mg total) by mouth daily.   multivitamin capsule Take 1 capsule by mouth daily.   Omega-3 Krill Oil 1000 MG Caps Take 1 capsule by mouth daily.  oxyCODONE 5 MG immediate release tablet Commonly known as:  Oxy IR/ROXICODONE Take 1-2 tablets (5-10 mg total) by mouth every 4 (four) hours as needed for moderate pain.   pantoprazole 20 MG tablet Commonly known as:   PROTONIX Take 1 tablet (20 mg total) by mouth daily.   rosuvastatin 20 MG tablet Commonly known as:  CRESTOR Take 0.5 tablets (10 mg total) by mouth daily.   saccharomyces boulardii 250 MG capsule Commonly known as:  FLORASTOR Take 1 capsule (250 mg total) by mouth 2 (two) times daily. You can get a probiotic over the counter.   sucralfate 1 GM/10ML suspension Commonly known as:  CARAFATE Take 10 mLs (1 g total) by mouth 4 (four) times daily -  with meals and at bedtime.        Follow-up Information    Odis Hollingshead, MD. Go on 01/27/2016.   Specialty:  General Surgery Why:  Your appointment is 02/12/2016 at 4pm. Please arrive 15 minutes early Contact information: [address] [place]  [address] [phone]        [facility], [place]. Go on 02/02/2016.   Specialty:  General Surgery Why:  Your appointment is 02/02/2016 at 10am. Please arrive 30 minutes prior to your appointment to fill out necessary paperwork. Contact information: [address] [place] [phone]          Signed: Jerrye Beavers, [facility] 01/29/2016, 2:54 PM Pager: [phone] Consults: [phone] Mon-Fri 7:00 am-4:30 pm Sat-Sun 7:00 am-11:30 am

## 2016-01-29 NOTE — Progress Notes (Signed)
Patient ID: Eric Olsen, male   DOB: 1961-01-01, 55 y.o.   MRN: [id number]  [facility] Progress Note  2 Days Post-Op  Subjective: Feeling well. No fevers over night. Pain well controlled. Tolerating diet. Passing flatus, no BM.   Objective: Vital signs in last 24 hours: Temp:  [98.1 F (36.7 C)-98.7 F (37.1 C)] 98.4 F (36.9 C) (11/02 0509) Pulse Rate:  [69-95] 69 (11/02 0509) Resp:  [16-18] 16 (11/02 0509) BP: (100-125)/(62-76) 125/72 (11/02 0509) SpO2:  [96 %-99 %] 99 % (11/02 0509) Last BM Date: 01/25/16  Intake/Output from previous day: 11/01 0701 - 11/02 0700 In: 2022.5 [P.O.:720; I.V.:1202.5; IV Piggyback:100] Out: [id number] [Urine:1325; Drains:70] Intake/Output this shift: No intake/output data recorded.  PE: Gen:  Alert, NAD, pleasant Card:  RRR Pulm:  CTAB Abd: Soft, ND, appropriately tender, +BS, incisions C/D/I, drain with serosanguinous drainage  Lab Results:   Recent Labs  01/26/16 2118 01/28/16 0448  WBC 11.1* 10.4  HGB 14.3 13.1  HCT 40.9 38.7*  PLT 145* 150   BMET  Recent Labs  01/26/16 1245 01/26/16 2118  NA 138  --   K 4.1  --   CL 104  --   CO2 27  --   GLUCOSE 98  --   BUN 18  --   CREATININE 0.85 0.96  CALCIUM 9.2  --    PT/INR No results for input(s): LABPROT, INR in the last 72 hours. CMP     Component Value Date/Time   NA 138 01/26/2016 1245   K 4.1 01/26/2016 1245   CL 104 01/26/2016 1245   CO2 27 01/26/2016 1245   GLUCOSE 98 01/26/2016 1245   BUN 18 01/26/2016 1245   CREATININE 0.96 01/26/2016 2118   CREATININE 0.98 08/06/2015 0830   CALCIUM 9.2 01/26/2016 1245   PROT 7.9 01/26/2016 1245   ALBUMIN 4.5 01/26/2016 1245   AST 26 01/26/2016 1245   ALT 17 01/26/2016 1245   ALKPHOS 52 01/26/2016 1245   BILITOT 0.8 01/26/2016 1245   GFRNONAA >60 01/26/2016 2118   GFRAA >60 01/26/2016 2118   Lipase     Component Value Date/Time   LIPASE 24 01/26/2016 1245       Studies/Results: Dg Cholangiogram  Operative  Result Date: 01/27/2016 CLINICAL DATA:  Cholecystectomy. EXAM: INTRAOPERATIVE CHOLANGIOGRAM TECHNIQUE: Cholangiographic images from the C-arm fluoroscopic device were submitted for interpretation post-operatively. Please see the procedural report for the amount of contrast and the fluoroscopy time utilized. COMPARISON:  CT 01/26/2016 FINDINGS: Extrahepatic biliary system is patent without large filling defects or obstruction. Contrast drains into the duodenum. Minimal filling of the central intrahepatic ducts. IMPRESSION: Patent biliary system. Electronically Signed   By: Markus Daft M.D.   On: 01/27/2016 11:01    Anti-infectives: Anti-infectives    Start     Dose/Rate Route Frequency Ordered Stop   01/26/16 2130  piperacillin-tazobactam (ZOSYN) IVPB 3.375 g     3.375 g 12.5 mL/hr over 240 Minutes Intravenous Every 8 hours 01/26/16 2059     01/26/16 1500  cefTRIAXone (ROCEPHIN) 2 g in dextrose 5 % 50 mL IVPB     2 g 100 mL/hr over 30 Minutes Intravenous  Once 01/26/16 1453 01/26/16 1650       Assessment/Plan S/p Laparoscopic cholecystectomy with cholangiogram 01/27/16 Dr. Zella Richer - POD 2 - drain 70cc/24hr, serosanguinous - pain controlled, passing flatus, tolerating diet  ID - zosyn 10/30>> FEN - regular VTE - heparin  Plan - ready for discharge. Will keep  drain and follow-up next week with nurse, follow-up 3 weeks with Dr. Zella Richer. Will take augmentin x5 days and a probiotic.    LOS: 1 day    Jerrye Beavers , [facility] 01/29/2016, 8:52 AM Pager: [phone] Consults: [phone] Mon-Fri 7:00 am-4:30 pm Sat-Sun 7:00 am-11:30 am

## 2016-04-05 DIAGNOSIS — C61 Malignant neoplasm of prostate: Secondary | ICD-10-CM | POA: Diagnosis not present

## 2016-04-12 DIAGNOSIS — N4341 Spermatocele of epididymis, single: Secondary | ICD-10-CM | POA: Diagnosis not present

## 2016-04-12 DIAGNOSIS — C61 Malignant neoplasm of prostate: Secondary | ICD-10-CM | POA: Diagnosis not present

## 2016-05-10 ENCOUNTER — Ambulatory Visit (INDEPENDENT_AMBULATORY_CARE_PROVIDER_SITE_OTHER): Payer: 59 | Admitting: Physician Assistant

## 2016-05-10 VITALS — BP 122/82 | HR 100 | Temp 98.2°F | Resp 18 | Wt 221.2 lb

## 2016-05-10 DIAGNOSIS — R69 Illness, unspecified: Secondary | ICD-10-CM | POA: Diagnosis not present

## 2016-05-10 DIAGNOSIS — D703 Neutropenia due to infection: Secondary | ICD-10-CM | POA: Diagnosis not present

## 2016-05-10 DIAGNOSIS — J111 Influenza due to unidentified influenza virus with other respiratory manifestations: Secondary | ICD-10-CM

## 2016-05-10 LAB — POCT CBC
GRANULOCYTE PERCENT: 61.2 % (ref 37–80)
HEMATOCRIT: 46 % (ref 43.5–53.7)
HEMOGLOBIN: 16.5 g/dL (ref 14.1–18.1)
Lymph, poc: 1 (ref 0.6–3.4)
MCH, POC: 33.7 pg — AB (ref 27–31.2)
MCHC: 36 g/dL — AB (ref 31.8–35.4)
MCV: 93.9 fL (ref 80–97)
MID (cbc): 0.5 (ref 0–0.9)
MPV: 7.6 fL (ref 0–99.8)
POC GRANULOCYTE: 2.4 (ref 2–6.9)
POC LYMPH PERCENT: 25.8 %L (ref 10–50)
POC MID %: 13 % — AB (ref 0–12)
Platelet Count, POC: 140 10*3/uL — AB (ref 142–424)
RBC: 4.9 M/uL (ref 4.69–6.13)
RDW, POC: 12.6 %
WBC: 4 10*3/uL — AB (ref 4.6–10.2)

## 2016-05-10 MED ORDER — NAPROXEN 500 MG PO TABS: 500.0000 mg | ORAL_TABLET | Freq: Two times a day (BID) | ORAL | 0 refills | Status: DC

## 2016-05-10 NOTE — Patient Instructions (Addendum)
Your peak flow today shows that your asthma is very well controlled right now.  It is okay to schedule your inhaler every 6-8 hours while you are getting over this illness which is certainly consistent with the flu.   I have sent in a prescription strength NSAID to help you with your symptoms.  Please continue delsym as needed.  Mucinex, or guaifenesin may help to thin secretions in your chest.      IF you received an x-ray today, you will receive an invoice from [facility]. Please contact [facility] at [phone] with questions or concerns regarding your invoice.   IF you received labwork today, you will receive an invoice from [facility]. Please contact [facility] at [phone] with questions or concerns regarding your invoice.   Our billing staff will not be able to assist you with questions regarding bills from these companies.  You will be contacted with the lab results as soon as they are available. The fastest way to get your results is to activate your My Chart account. Instructions are located on the last page of this paperwork. If you have not heard from [place] regarding the results in 2 weeks, please contact this office.

## 2016-05-10 NOTE — Progress Notes (Signed)
05/10/2016 11:17 AM   DOB: 12-25-1960 / MRN: [id number]  SUBJECTIVE:  Eric Olsen is a 56 y.o. male presenting for dry cough that started 4 days ago.  Reports the next day he had body aches, weakness, diarrhea, and keeps having nasal congestion.  Associates cold chills. Denies fever. Has tried tried Dayquil and Nyquil. He did not get the flu shot this year.  Reports he has always had asthma. Denies any outright exposures to flu, however a few people last week and work were coughing.   There is no immunization history on file for this patient.  He has No Known Allergies.   He  has a past medical history of Asthma; Dyslipidemia; History of DVT (deep vein thrombosis); History of pulmonary embolus (PE); Hypertension; Peripheral vascular disease ([place]); PONV (postoperative nausea and vomiting); Prostate cancer ([place]); and Recurrent upper respiratory infection (URI).    He  reports that he has never smoked. He has never used smokeless tobacco. He reports that he does not drink alcohol or use drugs. He  reports that he currently engages in sexual activity. The patient  has a past surgical history that includes Tonsillectomy; Cardiac catheterization (03/16/2005); Facial nerve decompression; Robot assisted laparoscopic radical prostatectomy (04/28/2011); transthoracic echocardiogram (2009); NM MYOCAR PERF WALL MOTION (2006); Prostate biopsy; and Cholecystectomy (N/A, 01/27/2016).  His family history includes Heart attack in his mother.  Review of Systems  Constitutional: Positive for chills, diaphoresis and malaise/fatigue. Negative for fever.  HENT: Positive for congestion.   Respiratory: Positive for cough, sputum production, shortness of breath and wheezing. Negative for hemoptysis.   Cardiovascular: Negative for chest pain.  Gastrointestinal: Positive for diarrhea. Negative for abdominal pain, blood in stool, constipation, melena, nausea and vomiting.  Musculoskeletal: Positive for myalgias.  Skin:  Negative for rash.  Neurological: Positive for dizziness (with stadning).    The problem list and medications were reviewed and updated by myself where necessary and exist elsewhere in the encounter.   OBJECTIVE:  BP 122/82 (Cuff Size: Normal)   Pulse 100   Temp 98.2 F (36.8 C) (Oral)   Resp 18   Wt 221 lb 3.2 oz (100.3 kg)   SpO2 97%   BMI 30.85 kg/m   Pulse Readings from Last 3 Encounters:  05/10/16 100  01/29/16 69  01/26/16 74     Physical Exam  Constitutional: He is oriented to person, place, and time.  Cardiovascular: Normal rate and regular rhythm.   Pulmonary/Chest: Effort normal and breath sounds normal.  Musculoskeletal: Normal range of motion. He exhibits no edema.  Neurological: He is alert and oriented to person, place, and time.  Skin: Skin is warm and dry.  Psychiatric: He has a normal mood and affect.    Results for orders placed or performed in visit on 05/10/16 (from the past 72 hour(s))  POCT CBC     Status: Abnormal   Collection Time: 05/10/16 11:07 AM  Result Value Ref Range   WBC 4.0 (A) 4.6 - 10.2 K/uL   Lymph, poc 1.0 0.6 - 3.4   POC LYMPH PERCENT 25.8 10 - 50 %L   MID (cbc) 0.5 0 - 0.9   POC MID % 13.0 (A) 0 - 12 %M   POC Granulocyte 2.4 2 - 6.9   Granulocyte percent 61.2 37 - 80 %G   RBC 4.90 4.69 - 6.13 M/uL   Hemoglobin 16.5 14.1 - 18.1 g/dL   HCT, POC 46.0 43.5 - 53.7 %   MCV 93.9 80 - 97  fL   MCH, POC 33.7 (A) 27 - 31.2 pg   MCHC 36.0 (A) 31.8 - 35.4 g/dL   RDW, POC 12.6 %   Platelet Count, POC 140 (A) 142 - 424 K/uL   MPV 7.6 0 - 99.8 fL    No results found.  ASSESSMENT AND PLAN:  Cuyler was seen today for uri.  Diagnoses and all orders for this visit:  Influenza-like illness Comments: He is doing very well despite clearly having the flu.  Too late for tamiflu. Advised inhler q6-8 hours, hydration, and short course of NSAIDs. RTC prn.  Orders: -     POCT CBC  Neutropenia associated with infection  ([place]) Comments: Consistent with the flu.  Orders: -     naproxen (NAPROSYN) 500 MG tablet; Take 1 tablet (500 mg total) by mouth 2 (two) times daily with a meal.    The patient is advised to call or return to clinic if he does not see an improvement in symptoms, or to seek the care of the closest emergency department if he worsens with the above plan.   Philis Fendt, MHS, PA-C [facility] 05/10/2016 11:17 AM

## 2016-05-24 ENCOUNTER — Telehealth: Payer: Self-pay | Admitting: Internal Medicine

## 2016-05-24 DIAGNOSIS — Z79899 Other long term (current) drug therapy: Secondary | ICD-10-CM

## 2016-05-24 NOTE — Telephone Encounter (Signed)
Orders entered and mailed to pt

## 2016-05-24 NOTE — Telephone Encounter (Signed)
New message    Please mail him the orders for his blood work please, so that he can have them done before his appt in may

## 2016-07-29 ENCOUNTER — Telehealth: Payer: Self-pay | Admitting: Internal Medicine

## 2016-07-29 NOTE — Telephone Encounter (Signed)
Communicated w patient, aware I will fax to provided number and mail to him, per his request.

## 2016-07-29 NOTE — Telephone Encounter (Signed)
Pt needs a lab order mailed to him please or fax it to [phone].

## 2016-08-05 ENCOUNTER — Other Ambulatory Visit: Payer: Self-pay | Admitting: *Deleted

## 2016-08-05 MED ORDER — LISINOPRIL 20 MG PO TABS: 20.0000 mg | ORAL_TABLET | Freq: Every day | ORAL | 0 refills | Status: DC

## 2016-08-06 ENCOUNTER — Other Ambulatory Visit: Payer: Self-pay | Admitting: Internal Medicine

## 2016-08-06 NOTE — Telephone Encounter (Signed)
REFILL 

## 2016-08-11 ENCOUNTER — Ambulatory Visit: Payer: 59 | Admitting: Internal Medicine

## 2016-08-13 ENCOUNTER — Ambulatory Visit (INDEPENDENT_AMBULATORY_CARE_PROVIDER_SITE_OTHER): Payer: 59 | Admitting: Internal Medicine

## 2016-08-13 ENCOUNTER — Encounter: Payer: Self-pay | Admitting: Internal Medicine

## 2016-08-13 VITALS — BP 122/84 | HR 80 | Ht 71.0 in | Wt 225.8 lb

## 2016-08-13 DIAGNOSIS — I10 Essential (primary) hypertension: Secondary | ICD-10-CM | POA: Diagnosis not present

## 2016-08-13 DIAGNOSIS — E785 Hyperlipidemia, unspecified: Secondary | ICD-10-CM

## 2016-08-13 DIAGNOSIS — C61 Malignant neoplasm of prostate: Secondary | ICD-10-CM | POA: Diagnosis not present

## 2016-08-13 MED ORDER — ROSUVASTATIN CALCIUM 10 MG PO TABS: 5.0000 mg | ORAL_TABLET | Freq: Every day | ORAL | 1 refills | Status: DC

## 2016-08-13 MED ORDER — LISINOPRIL 20 MG PO TABS: 20.0000 mg | ORAL_TABLET | Freq: Every day | ORAL | 3 refills | Status: DC

## 2016-08-13 NOTE — Progress Notes (Signed)
Grossly normal   OFFICE NOTE  Chief Complaint:  No complaints  Primary Care Physician: Donald Prose, MD  HPI:  Eric Olsen  is a 56 year old gentleman who is a patient of yours who has a history of hypertension, dyslipidemia and prostate cancer which he has been cured of. He also had a history of DVT and PE in the past and was thought to be I guess due to hypercoagulable state. Overall he is doing very well. At last visit we were working on weight loss and he has managed to do that, down to 203 today from 212 pounds. Blood pressure remains well controlled at 128/84. He is exercising more often than he had been previously.   We did obtain a lipid profile on him at his last visit including particle numbers which showed an LDL particle number of 1578 despite the fact that his calculated LDL was 88 and his triglycerides were 130, total cholesterol 155 with an HDL of 41. While this appears to be a fairly normal lipid profile based on cholesterol content, the high particle number indicates excess risk. I did recommend starting Crestor 20 mg daily. He reports he's had some mild muscle pains with this and has decreased the dose to 10 mg every other day fairly recently. However he did have a recent lipid profile which shows a marked improvement. LDL particle numbers now decreased to 688, and LDL-C. is 50.  This is associated with a marked decrease in his risk for cardiovascular events.  I saw Eric Olsen back in the office today. He is doing well and denies any chest pain or shortness of breath. He is reporting some insomnia. This lead to some daytime fatigue and occasionally naps at lunch time. Unfortunately he's gained back about 20 pounds and needs to continue to work on weight loss. He says he started to do some walking at lunch and in the evenings. He had recent blood work performed in March 2016 which showed total cholesterol 102, triglycerides 108, HDL 35 and LDL 45. This is after decreasing his  Crestor down to 10 mg daily.  08/12/2015  Eric Olsen returns today for follow-up. Generally seems to be doing well. He does get some fatigue but that seems to be related to radiation therapy for his prostate. He recently had a recheck of his lipid profile which shows excellent cholesterol control. Total cholesterol 94, triglycerides 128, HDL 34 and LDL 34.  08/13/2016  Eric Olsen was seen today in follow-up. He reports he's done well with cirrhosis prostate cancer. He says he's had no recurrence and is finished x-ray therapy. He is lipids are very well controlled. His recent total cluster was 101, turgor shows 137, HDL-C 30, LDL-C 44 (down from 52). He's currently on rosuvastatin 10 mg daily. He has a small amount awaking with decreased exercise reports a number of viral illnesses over the winter but is generally doing better. He's planning on starting back with more regular exercise. Denies any chest pain or worsening shortness of breath. Blood pressure is perfect today 122/84.  PMHx:  Past Medical History:  Diagnosis Date  . Asthma    states well controlled  . Dyslipidemia   . History of DVT (deep vein thrombosis)    following achilles tendon surgery  . History of pulmonary embolus (PE)    following achilles tendon surgery   . Hypertension    cardio Dr Debara Pickett-  LOV note 2/12 on chart  . Peripheral vascular disease ([place])  hx DVT 10 years ago following achilles tendon tear & PE-  last anticoagulation years ago  . PONV (postoperative nausea and vomiting)   . Prostate cancer [facility])    prostate  . Recurrent upper respiratory infection (URI)    recent head cold- was on augumentin- states no fever- states resolved    Past Surgical History:  Procedure Laterality Date  . CARDIAC CATHETERIZATION  03/16/2005   RCA is large & dominant, no lesions, EF 60% (Dr. Domenic Moras)  . CHOLECYSTECTOMY N/A 01/27/2016   Procedure: LAPAROSCOPIC CHOLECYSTECTOMY WITH INTRAOPERATIVE CHOLANGIOGRAM;  Surgeon:  Jackolyn Confer, MD;  Location: [facility];  Service: General;  Laterality: N/A;  . FACIAL NERVE DECOMPRESSION     right elbow- radial   . NM MYOCAR PERF WALL MOTION  2006   persantine study - inferior wall thinning; EF 63%; low risk  . PROSTATE BIOPSY    . ROBOT ASSISTED LAPAROSCOPIC RADICAL PROSTATECTOMY  04/28/2011   Procedure: ROBOTIC ASSISTED LAPAROSCOPIC RADICAL PROSTATECTOMY;  Surgeon: Bernestine Amass, MD;  Location: [facility];  Service: Urology;  Laterality: N/A;  . TONSILLECTOMY    . TRANSTHORACIC ECHOCARDIOGRAM  2009   EF=>55%, borderline conc LVH, trace MR/TR    FAMHx:  Family History  Problem Relation Age of Onset  . Heart attack Mother     SOCHx:   reports that he has never smoked. He has never used smokeless tobacco. He reports that he does not drink alcohol or use drugs.  ALLERGIES:  No Known Allergies  ROS: Pertinent items noted in HPI and remainder of comprehensive ROS otherwise negative.  HOME MEDS: Current Outpatient Prescriptions  Medication Sig Dispense Refill  . albuterol (PROVENTIL HFA;VENTOLIN HFA) 108 (90 BASE) MCG/ACT inhaler Inhale 2 puffs into the lungs every 6 (six) hours as needed for wheezing or shortness of breath.     Marland Kitchen aspirin 81 MG tablet Take 81 mg by mouth daily.    . Garlic 2956 MG CAPS Take 1 capsule by mouth daily.    Javier Docker Oil 350 MG CAPS Take 1 capsule by mouth daily.    Marland Kitchen lisinopril (PRINIVIL,ZESTRIL) 20 MG tablet Take 1 tablet (20 mg total) by mouth daily. 90 tablet 3  . Multiple Vitamin (MULTIVITAMIN) capsule Take 1 capsule by mouth daily.    . pantoprazole (PROTONIX) 20 MG tablet Take 1 tablet (20 mg total) by mouth daily. 30 tablet 0  . rosuvastatin (CRESTOR) 10 MG tablet Take 0.5 tablets (5 mg total) by mouth daily. 90 tablet 1   No current facility-administered medications for this visit.     LABS/IMAGING: No results found for this or any previous visit (from the past 48 hour(s)). No results found.  VITALS: BP 122/84   Pulse  80   Ht 5\' 11"  (1.803 m)   Wt 225 lb 12.8 oz (102.4 kg)   BMI 31.49 kg/m   EXAM: General appearance: alert and no distress Neck: no carotid bruit and no JVD Lungs: clear to auscultation bilaterally Heart: regular rate and rhythm, S1, S2 normal, no murmur, click, rub or gallop Abdomen: soft, non-tender; bowel sounds normal; no masses,  no organomegaly Extremities: extremities normal, atraumatic, no cyanosis or edema Pulses: 2+ and symmetric Skin: Skin color, texture, turgor normal. No rashes or lesions Neurologic: GroPsych: Pleasanty normal Psych: Pleasant  EKG: Normal sinus rhythm at 80  ASSESSMENT: 1. Hypertension-controlled 2. Dyslipidemia at goal  3. History prostate cancer with recurrence- s/p XRT  PLAN: 1.   Eric Olsen is doing well and  completed XRT for prostate cancer. Blood pressure is very well controlled. His cholesterol is at goal if not lower. His target probably is less than 100 based on relatively few risk factors for LDL however he's been consistently below 50 and currently his LDL is 44.  Although there is more and more evidence that lower LDL is beneficial, particular given his family history of early-onset heart disease, he has relatively few personal risk factors and his ten-year ASCVD risk is less than 5%. I would recommend decreasing his Crestor to 5 mg daily as we will still be adequately treating him to reduce risk and hopefully we can reduce and potential side effects.  Follow-up with me annually or sooner as necessary.  Pixie Casino, MD, [facility] Attending Cardiologist [place] 08/13/2016, 1:46 PM

## 2016-08-13 NOTE — Patient Instructions (Signed)
Your physician has recommended you make the following change in your medication: DECREASE crestor to 5mg  daily  Your physician wants you to follow-up in: ONE YEAR with Dr. Debara Pickett. You will receive a reminder letter in the mail two months in advance. If you don't receive a letter, please call our office to schedule the follow-up appointment.

## 2016-09-17 DIAGNOSIS — K219 Gastro-esophageal reflux disease without esophagitis: Secondary | ICD-10-CM | POA: Diagnosis not present

## 2016-09-17 DIAGNOSIS — R1310 Dysphagia, unspecified: Secondary | ICD-10-CM | POA: Diagnosis not present

## 2016-09-17 DIAGNOSIS — Z79899 Other long term (current) drug therapy: Secondary | ICD-10-CM | POA: Diagnosis not present

## 2016-09-20 ENCOUNTER — Other Ambulatory Visit: Payer: Self-pay | Admitting: Physician Assistant

## 2016-09-20 DIAGNOSIS — K219 Gastro-esophageal reflux disease without esophagitis: Secondary | ICD-10-CM

## 2016-09-20 DIAGNOSIS — R1310 Dysphagia, unspecified: Secondary | ICD-10-CM

## 2016-09-20 DIAGNOSIS — R1319 Other dysphagia: Secondary | ICD-10-CM

## 2016-09-21 ENCOUNTER — Ambulatory Visit
Admission: RE | Admit: 2016-09-21 | Discharge: 2016-09-21 | Disposition: A | Discharge status: Home / Self Care | Payer: 59 | Source: Ambulatory Visit | Attending: Physician Assistant | Admitting: Physician Assistant

## 2016-09-21 DIAGNOSIS — R1310 Dysphagia, unspecified: Secondary | ICD-10-CM

## 2016-09-21 DIAGNOSIS — K219 Gastro-esophageal reflux disease without esophagitis: Secondary | ICD-10-CM | POA: Diagnosis not present

## 2016-09-21 DIAGNOSIS — K449 Diaphragmatic hernia without obstruction or gangrene: Secondary | ICD-10-CM | POA: Diagnosis not present

## 2016-09-21 DIAGNOSIS — R1319 Other dysphagia: Secondary | ICD-10-CM

## 2016-09-22 ENCOUNTER — Other Ambulatory Visit: Payer: 59

## 2016-10-11 DIAGNOSIS — C61 Malignant neoplasm of prostate: Secondary | ICD-10-CM | POA: Diagnosis not present

## 2016-10-18 DIAGNOSIS — K2960 Other gastritis without bleeding: Secondary | ICD-10-CM | POA: Diagnosis not present

## 2016-10-18 DIAGNOSIS — R1310 Dysphagia, unspecified: Secondary | ICD-10-CM | POA: Diagnosis not present

## 2016-10-18 DIAGNOSIS — K219 Gastro-esophageal reflux disease without esophagitis: Secondary | ICD-10-CM | POA: Diagnosis not present

## 2016-12-06 DIAGNOSIS — K219 Gastro-esophageal reflux disease without esophagitis: Secondary | ICD-10-CM | POA: Diagnosis not present

## 2016-12-06 DIAGNOSIS — Z86010 Personal history of colonic polyps: Secondary | ICD-10-CM | POA: Diagnosis not present

## 2017-01-31 DIAGNOSIS — J45901 Unspecified asthma with (acute) exacerbation: Secondary | ICD-10-CM | POA: Diagnosis not present

## 2017-06-13 DIAGNOSIS — C61 Malignant neoplasm of prostate: Secondary | ICD-10-CM | POA: Diagnosis not present

## 2017-06-23 DIAGNOSIS — C61 Malignant neoplasm of prostate: Secondary | ICD-10-CM | POA: Diagnosis not present

## 2017-08-09 ENCOUNTER — Other Ambulatory Visit: Payer: Self-pay | Admitting: Internal Medicine

## 2017-09-17 ENCOUNTER — Other Ambulatory Visit: Payer: Self-pay | Admitting: Internal Medicine

## 2017-09-19 NOTE — Telephone Encounter (Signed)
Rx request sent to pharmacy.  

## 2017-09-20 DIAGNOSIS — J019 Acute sinusitis, unspecified: Secondary | ICD-10-CM | POA: Diagnosis not present

## 2017-09-22 DIAGNOSIS — C61 Malignant neoplasm of prostate: Secondary | ICD-10-CM | POA: Diagnosis not present

## 2017-10-26 IMAGING — RF DG ESOPHAGUS
5 series · 14 of 18 positions shown · non-contrast
Comparison: None in PACs

CLINICAL DATA: Dysphagia, gastroesophageal reflux.

EXAM:
ESOPHOGRAM / BARIUM SWALLOW / BARIUM TABLET STUDY
TECHNIQUE: Combined double contrast and single contrast examination performed
using effervescent crystals, thick barium liquid, and thin barium
liquid. The patient was observed with fluoroscopy swallowing a 13 mm
barium sulphate tablet.
FLUOROSCOPY TIME:  Fluoroscopy Time:  1 minutes, 54 seconds
Radiation Exposure Index (if provided by the fluoroscopic device):
70 mGy a
Number of Acquired Spot Images: 2+ 4 video loops.

[Series 1: sequence · 3 of 19 frames shown (1 of 4)]
[frame 3/19]
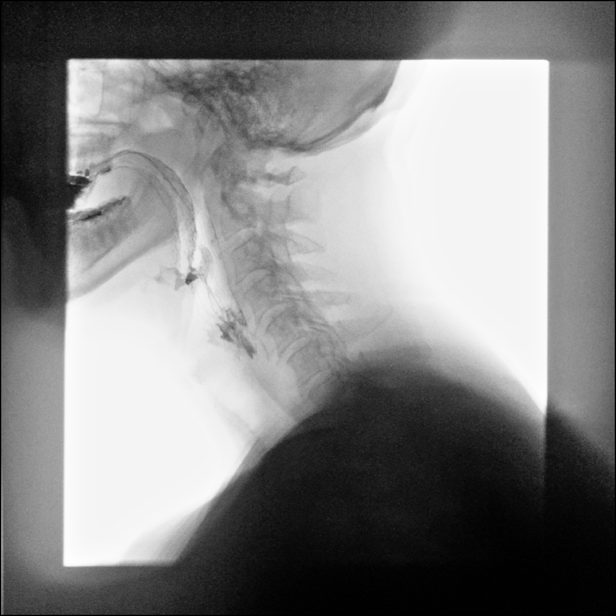
[frame 10/19]
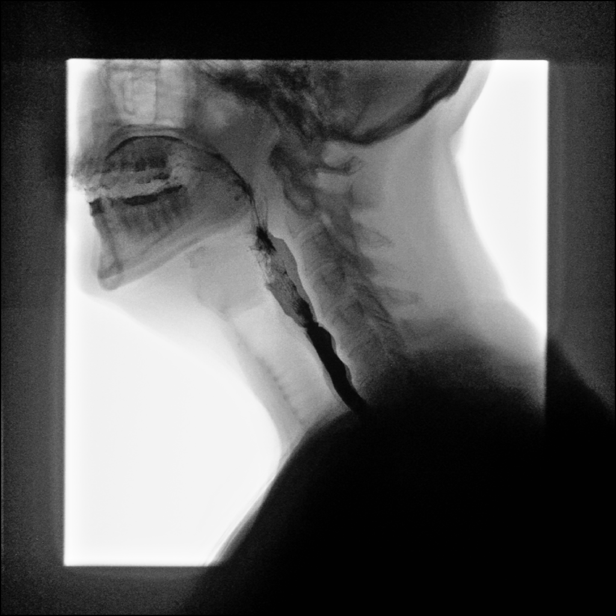
[frame 17/19]
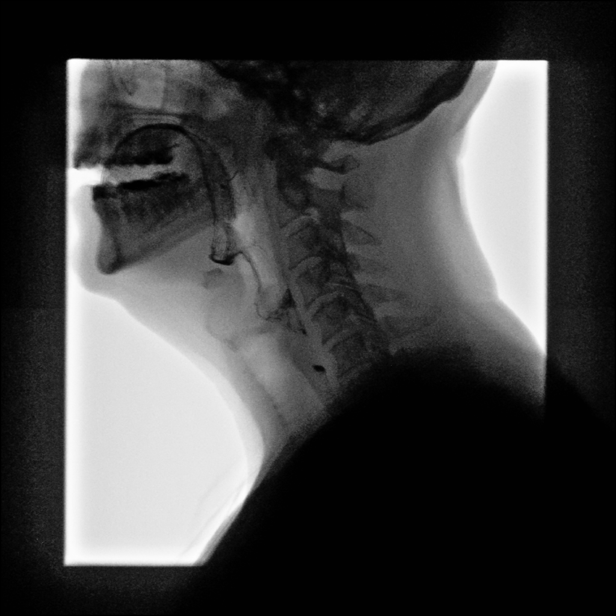

[Series 2: sequence · 3 of 12 frames shown (2 of 4)]
[frame 1/12]
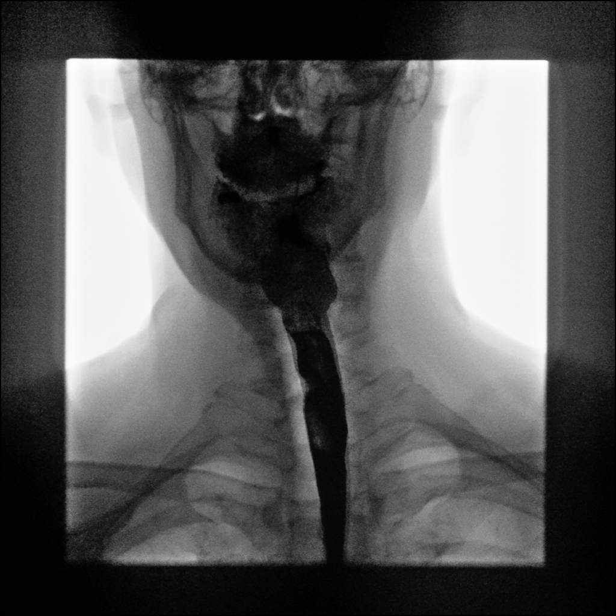
[frame 2/12]
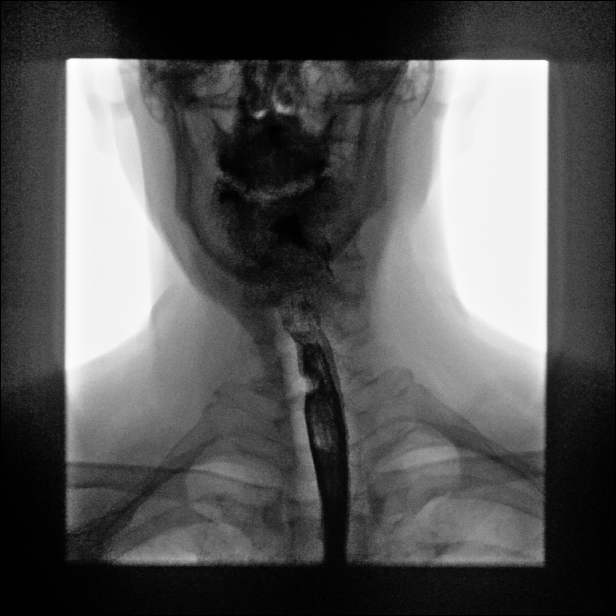
[frame 11/12]
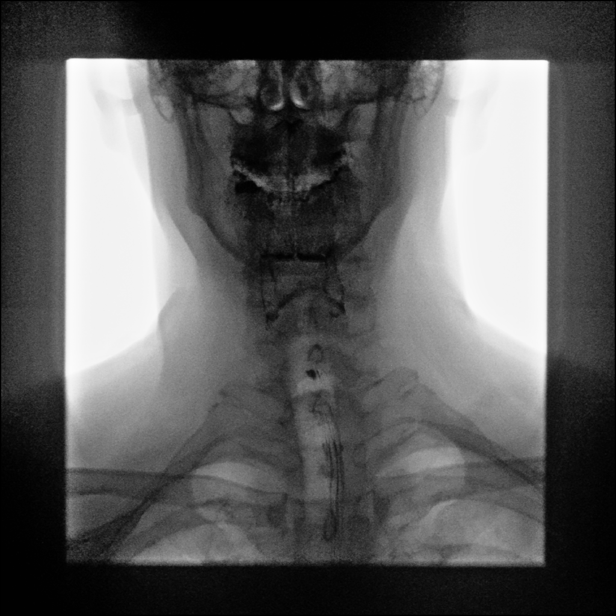

[Series 3: sequence · 3 of 37 frames shown (3 of 4)]
[frame 6/37]
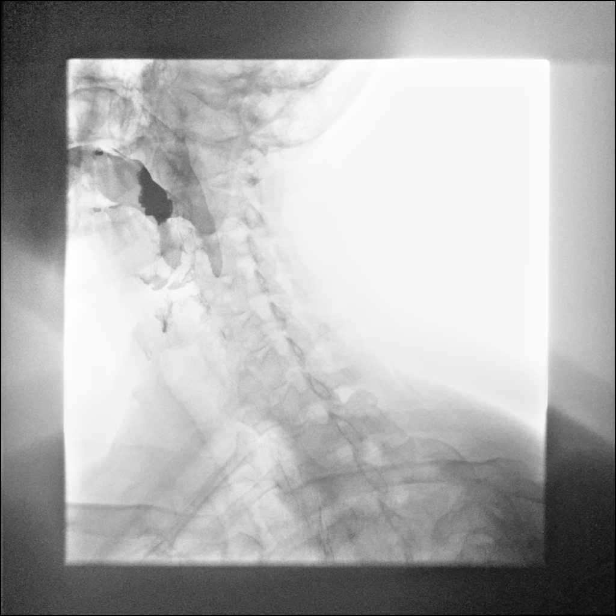
[frame 17/37]
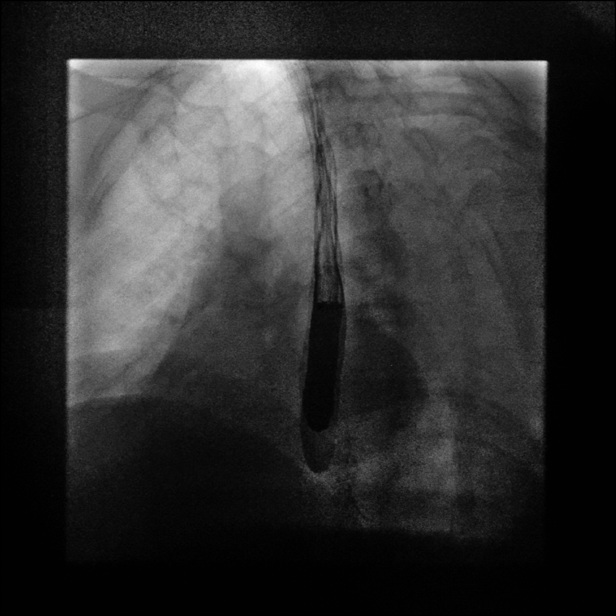
[frame 19/37]
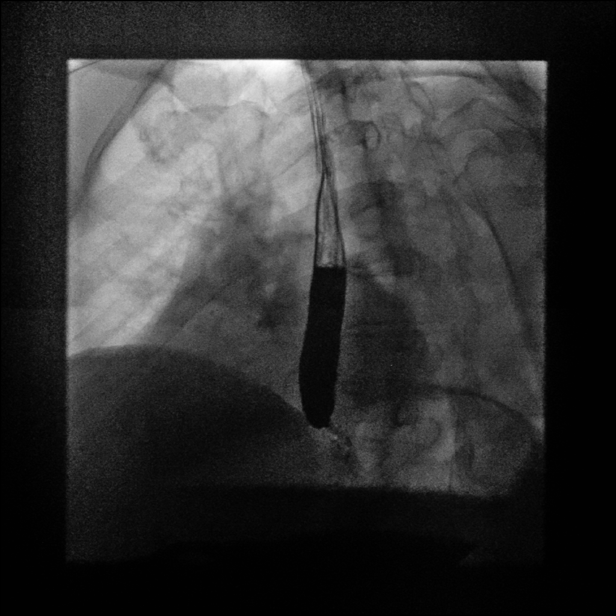

[Series 4: sequence · 3 of 39 frames shown (4 of 4)]
[frame 6/39]
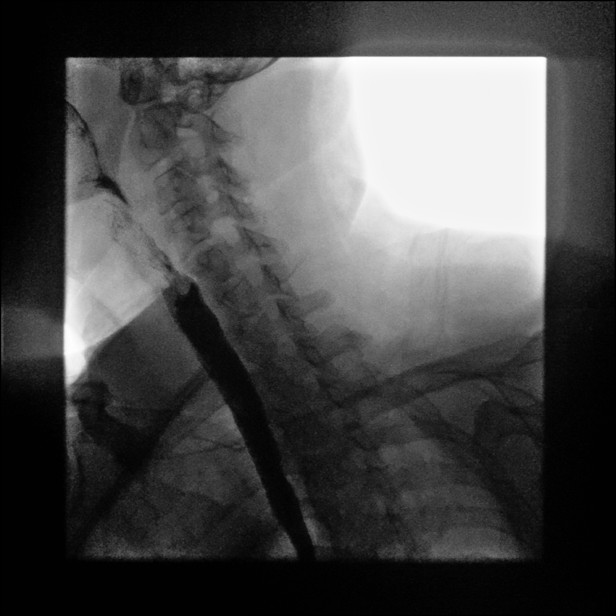
[frame 15/39]
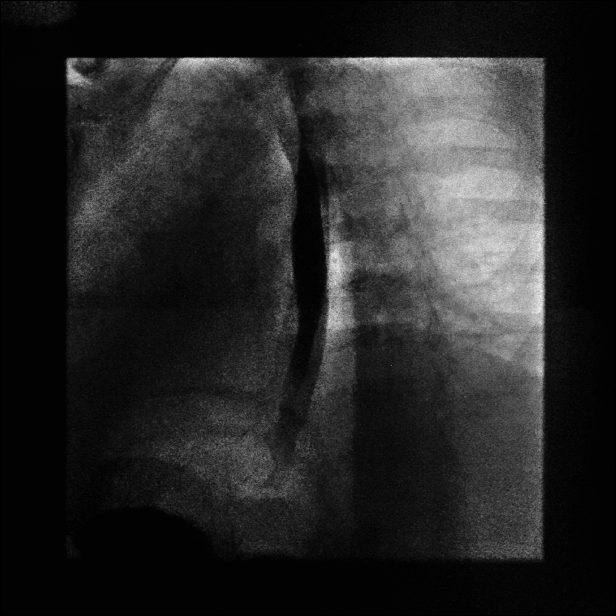
[frame 20/39]
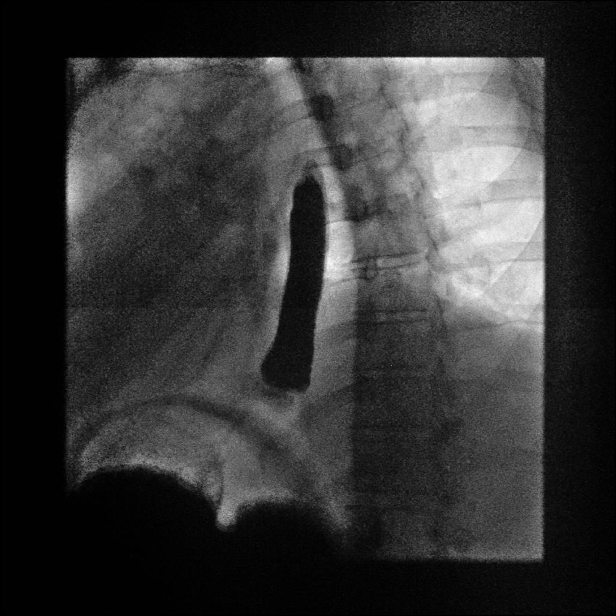

[Series 5: one shot · 2 of 2 slices shown]
[im 1/2]
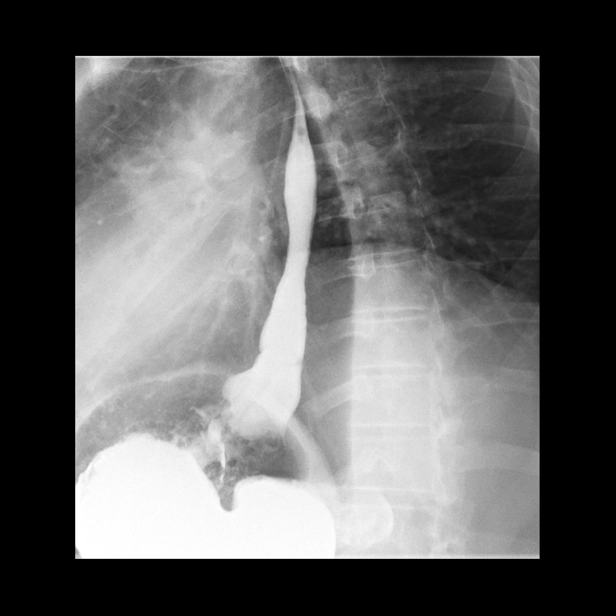
[im 2/2]
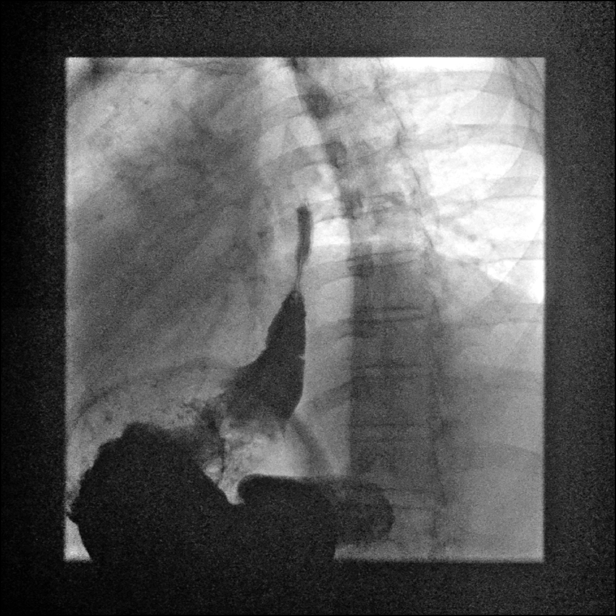

[14 of 18 positions shown; findings below may reference images not displayed]

FINDINGS: The anticipated procedure was discussed with Mr. Sexton. He voiced
his willingness to proceed. The patient ingested thick and thin
barium and the gas-forming crystals without difficulty. The cervical
esophagus distended well. There was no laryngeal penetration of the
barium. The thoracic esophagus distended well. No fixed stricture
was observed. There was small reducible hiatal hernia. Minimal
mucosal irregularity just above the hiatal hernia was noted. The
barium tablet passed promptly from the mouth to the stomach.
IMPRESSION: Small reducible hiatal hernia. Subtle mucosal changes in the distal
esophagus may reflect esophagitis. No evidence of stricture or
significant spasm. Normal appearing cervical and proximal and mid
thoracic esophagus.

## 2017-11-08 ENCOUNTER — Other Ambulatory Visit: Payer: Self-pay | Admitting: Internal Medicine

## 2018-01-16 ENCOUNTER — Ambulatory Visit: Payer: 59 | Admitting: Internal Medicine

## 2018-01-16 ENCOUNTER — Encounter: Payer: Self-pay | Admitting: Internal Medicine

## 2018-01-16 VITALS — BP 122/90 | HR 75 | Ht 71.0 in | Wt 231.4 lb

## 2018-01-16 DIAGNOSIS — I10 Essential (primary) hypertension: Secondary | ICD-10-CM

## 2018-01-16 DIAGNOSIS — E785 Hyperlipidemia, unspecified: Secondary | ICD-10-CM | POA: Diagnosis not present

## 2018-01-16 MED ORDER — ROSUVASTATIN CALCIUM 10 MG PO TABS: 5.0000 mg | ORAL_TABLET | Freq: Every day | ORAL | 1 refills | Status: DC

## 2018-01-16 MED ORDER — LISINOPRIL 20 MG PO TABS: 20.0000 mg | ORAL_TABLET | Freq: Every day | ORAL | 3 refills | Status: DC

## 2018-01-16 NOTE — Patient Instructions (Signed)
Medication Instructions:  Continue current medications If you need a refill on your cardiac medications before your next appointment, please call your pharmacy.   Lab work: FASTING lab work to check cholesterol If you have labs (blood work) drawn today and your tests are completely normal, you will receive your results only by: . MyChart Message (if you have MyChart) OR . A paper copy in the mail If you have any lab test that is abnormal or we need to change your treatment, we will call you to review the results.  Testing/Procedures: NONE  Follow-Up: At [facility], you and your health needs are our priority.  As part of our continuing mission to provide you with exceptional heart care, we have created designated Provider Care Teams.  These Care Teams include your primary Cardiologist (physician) and Advanced Practice Providers (APPs -  Physician Assistants and Nurse Practitioners) who all work together to provide you with the care you need, when you need it. You will need a follow up appointment in 12 months.  Please call our office 2 months in advance to schedule this appointment.  You may see Dr. Hilty or one of the following Advanced Practice Providers on your designated Care Team: Hao Meng, PA-C . Angela Duke, PA-C  Any Other Special Instructions Will Be Listed Below (If Applicable).    

## 2018-01-16 NOTE — Progress Notes (Signed)
OFFICE NOTE  Chief Complaint:  No complaints  Primary Care Physician: Donald Prose, MD  HPI:  Eric Olsen  is a 57 year old gentleman who is a patient of yours who has a history of hypertension, dyslipidemia and prostate cancer which he has been cured of. He also had a history of DVT and PE in the past and was thought to be I guess due to hypercoagulable state. Overall he is doing very well. At last visit we were working on weight loss and he has managed to do that, down to 203 today from 212 pounds. Blood pressure remains well controlled at 128/84. He is exercising more often than he had been previously.   We did obtain a lipid profile on him at his last visit including particle numbers which showed an LDL particle number of 1578 despite the fact that his calculated LDL was 88 and his triglycerides were 130, total cholesterol 155 with an HDL of 41. While this appears to be a fairly normal lipid profile based on cholesterol content, the high particle number indicates excess risk. I did recommend starting Crestor 20 mg daily. He reports he's had some mild muscle pains with this and has decreased the dose to 10 mg every other day fairly recently. However he did have a recent lipid profile which shows a marked improvement. LDL particle numbers now decreased to 688, and LDL-C. is 50.  This is associated with a marked decrease in his risk for cardiovascular events.  I saw Eric Olsen back in the office today. He is doing well and denies any chest pain or shortness of breath. He is reporting some insomnia. This lead to some daytime fatigue and occasionally naps at lunch time. Unfortunately he's gained back about 20 pounds and needs to continue to work on weight loss. He says he started to do some walking at lunch and in the evenings. He had recent blood work performed in March 2016 which showed total cholesterol 102, triglycerides 108, HDL 35 and LDL 45. This is after decreasing his Crestor down to  10 mg daily.  08/12/2015  [name] returns today for follow-up. Generally seems to be doing well. He does get some fatigue but that seems to be related to radiation therapy for his prostate. He recently had a recheck of his lipid profile which shows excellent cholesterol control. Total cholesterol 94, triglycerides 128, HDL 34 and LDL 34.  08/13/2016  Eric Olsen was seen today in follow-up. He reports he's done well with cirrhosis prostate cancer. He says he's had no recurrence and is finished x-ray therapy. He is lipids are very well controlled. His recent total cluster was 101, turgor shows 137, HDL-C 30, LDL-C 44 (down from 52). He's currently on rosuvastatin 10 mg daily. He has a small amount awaking with decreased exercise reports a number of viral illnesses over the winter but is generally doing better. He's planning on starting back with more regular exercise. Denies any chest pain or worsening shortness of breath. Blood pressure is perfect today 122/84.  01/16/2018  Eric Olsen returns today in follow-up.  Over the year he is done well.  He denies any new chest pain or worsening shortness of breath.  He did start recently on Singulair as he has had some worsening asthma.  He did have childhood asthma but it apparently improved.  He has no cardiovascular disease history in fact had a heart cath in 2006 with no coronary disease.  There is family history of  early onset heart disease.  His last lipid profile 2 years ago showed excellent LDL of 34 on low-dose rosuvastatin 5 mg.  We argued whether or not he needs to be on statin medication, however he is tolerated it well and is comfortable taking it.    PMHx:  Past Medical History:  Diagnosis Date  . Asthma    states well controlled  . Dyslipidemia   . History of DVT (deep vein thrombosis)    following achilles tendon surgery  . History of pulmonary embolus (PE)    following achilles tendon surgery   . Hypertension    cardio Dr Debara Pickett-   LOV note 2/12 on chart  . Peripheral vascular disease ([facility])    hx DVT 10 years ago following achilles tendon tear & PE-  last anticoagulation years ago  . PONV (postoperative nausea and vomiting)   . Prostate cancer [facility])    prostate  . Recurrent upper respiratory infection (URI)    recent head cold- was on augumentin- states no fever- states resolved    Past Surgical History:  Procedure Laterality Date  . CARDIAC CATHETERIZATION  03/16/2005   RCA is large & dominant, no lesions, EF 60% (Dr. Domenic Moras)  . CHOLECYSTECTOMY N/A 01/27/2016   Procedure: LAPAROSCOPIC CHOLECYSTECTOMY WITH INTRAOPERATIVE CHOLANGIOGRAM;  Surgeon: Jackolyn Confer, MD;  Location: [facility];  Service: General;  Laterality: N/A;  . FACIAL NERVE DECOMPRESSION     right elbow- radial   . NM MYOCAR PERF WALL MOTION  2006   persantine study - inferior wall thinning; EF 63%; low risk  . PROSTATE BIOPSY    . ROBOT ASSISTED LAPAROSCOPIC RADICAL PROSTATECTOMY  04/28/2011   Procedure: ROBOTIC ASSISTED LAPAROSCOPIC RADICAL PROSTATECTOMY;  Surgeon: Bernestine Amass, MD;  Location: [facility];  Service: Urology;  Laterality: N/A;  . TONSILLECTOMY    . TRANSTHORACIC ECHOCARDIOGRAM  2009   EF=>55%, borderline conc LVH, trace MR/TR    FAMHx:  Family History  Problem Relation Age of Onset  . Heart attack Mother     SOCHx:   reports that he has never smoked. He has never used smokeless tobacco. He reports that he does not drink alcohol or use drugs.  ALLERGIES:  No Known Allergies  ROS: Pertinent items noted in HPI and remainder of comprehensive ROS otherwise negative.  HOME MEDS: Current Outpatient Medications  Medication Sig Dispense Refill  . albuterol (PROVENTIL HFA;VENTOLIN HFA) 108 (90 BASE) MCG/ACT inhaler Inhale 2 puffs into the lungs every 6 (six) hours as needed for wheezing or shortness of breath.     Marland Kitchen aspirin 81 MG tablet Take 81 mg by mouth daily.    . Garlic 8338 MG CAPS Take 1 capsule by mouth daily.    Javier Docker Oil 350 MG CAPS Take 1 capsule by mouth daily.    Marland Kitchen lisinopril (PRINIVIL,ZESTRIL) 20 MG tablet TAKE 1 TABLET BY MOUTH EVERY DAY 90 tablet 0  . montelukast (SINGULAIR) 10 MG tablet Take 10 mg by mouth daily.  3  . Multiple Vitamin (MULTIVITAMIN) capsule Take 1 capsule by mouth daily.    . pantoprazole (PROTONIX) 20 MG tablet Take 1 tablet (20 mg total) by mouth daily. 30 tablet 0  . rosuvastatin (CRESTOR) 10 MG tablet Take 0.5 tablets (5 mg total) by mouth daily. Please schedule appointment for refills. 90 tablet 1   No current facility-administered medications for this visit.     LABS/IMAGING: No results found for this or any previous visit (from the past 48  hour(s)). No results found.  VITALS: BP 122/90   Pulse 75   Ht 5\' 11"  (1.803 m)   Wt 231 lb 6.4 oz (105 kg)   BMI 32.27 kg/m   EXAM: General appearance: alert and no distress Neck: no carotid bruit and no JVD Lungs: clear to auscultation bilaterally Heart: regular rate and rhythm, S1, S2 normal, no murmur, click, rub or gallop Abdomen: soft, non-tender; bowel sounds normal; no masses,  no organomegaly Extremities: extremities normal, atraumatic, no cyanosis or edema Pulses: 2+ and symmetric Skin: Skin color, texture, turgor normal. No rashes or lesions Neurologic: GroPsych: Pleasanty normal Psych: Pleasant  EKG: Normal sinus rhythm 75-personally reviewed  ASSESSMENT: 1. Hypertension-controlled 2. Dyslipidemia at goal  3. History prostate cancer with recurrence- s/p XRT  PLAN: 1.   Mr. Georg new complaints and is done well over the past year.  His blood pressure is well controlled.  His cholesterol has been excellent and quite low on therapy.  We discussed possibly coming off of statin therapy as he would reach his target's however he was comfortable continuing on it at this point.  Repeat lipid profile today.  Follow-up with me annually or sooner as necessary.  Pixie Casino, MD, [facility], [place] Director of the Advanced Lipid Disorders &  Cardiovascular Risk Reduction Clinic Diplomate of the American Board of Clinical Lipidology Attending Cardiologist  Direct Dial: [phone]  Fax: [phone]  Website:  [email or web address] [place] 01/16/2018, 8:10 AM

## 2018-01-30 DIAGNOSIS — M4602 Spinal enthesopathy, cervical region: Secondary | ICD-10-CM | POA: Diagnosis not present

## 2018-01-30 DIAGNOSIS — G44209 Tension-type headache, unspecified, not intractable: Secondary | ICD-10-CM | POA: Diagnosis not present

## 2018-01-30 DIAGNOSIS — M9901 Segmental and somatic dysfunction of cervical region: Secondary | ICD-10-CM | POA: Diagnosis not present

## 2018-01-31 DIAGNOSIS — G44209 Tension-type headache, unspecified, not intractable: Secondary | ICD-10-CM | POA: Diagnosis not present

## 2018-01-31 DIAGNOSIS — M4602 Spinal enthesopathy, cervical region: Secondary | ICD-10-CM | POA: Diagnosis not present

## 2018-01-31 DIAGNOSIS — M9901 Segmental and somatic dysfunction of cervical region: Secondary | ICD-10-CM | POA: Diagnosis not present

## 2018-02-01 DIAGNOSIS — G44209 Tension-type headache, unspecified, not intractable: Secondary | ICD-10-CM | POA: Diagnosis not present

## 2018-02-01 DIAGNOSIS — M4602 Spinal enthesopathy, cervical region: Secondary | ICD-10-CM | POA: Diagnosis not present

## 2018-02-01 DIAGNOSIS — M9901 Segmental and somatic dysfunction of cervical region: Secondary | ICD-10-CM | POA: Diagnosis not present

## 2018-02-07 DIAGNOSIS — G44209 Tension-type headache, unspecified, not intractable: Secondary | ICD-10-CM | POA: Diagnosis not present

## 2018-02-07 DIAGNOSIS — M4602 Spinal enthesopathy, cervical region: Secondary | ICD-10-CM | POA: Diagnosis not present

## 2018-02-07 DIAGNOSIS — M9901 Segmental and somatic dysfunction of cervical region: Secondary | ICD-10-CM | POA: Diagnosis not present

## 2018-02-08 DIAGNOSIS — C61 Malignant neoplasm of prostate: Secondary | ICD-10-CM | POA: Diagnosis not present

## 2018-02-08 DIAGNOSIS — R300 Dysuria: Secondary | ICD-10-CM | POA: Diagnosis not present

## 2018-02-09 DIAGNOSIS — G44209 Tension-type headache, unspecified, not intractable: Secondary | ICD-10-CM | POA: Diagnosis not present

## 2018-02-09 DIAGNOSIS — M4602 Spinal enthesopathy, cervical region: Secondary | ICD-10-CM | POA: Diagnosis not present

## 2018-02-09 DIAGNOSIS — M9901 Segmental and somatic dysfunction of cervical region: Secondary | ICD-10-CM | POA: Diagnosis not present

## 2018-02-14 DIAGNOSIS — G44209 Tension-type headache, unspecified, not intractable: Secondary | ICD-10-CM | POA: Diagnosis not present

## 2018-02-14 DIAGNOSIS — M9901 Segmental and somatic dysfunction of cervical region: Secondary | ICD-10-CM | POA: Diagnosis not present

## 2018-02-14 DIAGNOSIS — M4602 Spinal enthesopathy, cervical region: Secondary | ICD-10-CM | POA: Diagnosis not present

## 2018-02-16 DIAGNOSIS — M4602 Spinal enthesopathy, cervical region: Secondary | ICD-10-CM | POA: Diagnosis not present

## 2018-02-16 DIAGNOSIS — G44209 Tension-type headache, unspecified, not intractable: Secondary | ICD-10-CM | POA: Diagnosis not present

## 2018-02-16 DIAGNOSIS — M9901 Segmental and somatic dysfunction of cervical region: Secondary | ICD-10-CM | POA: Diagnosis not present

## 2018-02-21 DIAGNOSIS — M4602 Spinal enthesopathy, cervical region: Secondary | ICD-10-CM | POA: Diagnosis not present

## 2018-02-21 DIAGNOSIS — M9901 Segmental and somatic dysfunction of cervical region: Secondary | ICD-10-CM | POA: Diagnosis not present

## 2018-02-21 DIAGNOSIS — G44209 Tension-type headache, unspecified, not intractable: Secondary | ICD-10-CM | POA: Diagnosis not present

## 2018-02-28 DIAGNOSIS — G44209 Tension-type headache, unspecified, not intractable: Secondary | ICD-10-CM | POA: Diagnosis not present

## 2018-02-28 DIAGNOSIS — M4602 Spinal enthesopathy, cervical region: Secondary | ICD-10-CM | POA: Diagnosis not present

## 2018-02-28 DIAGNOSIS — M9901 Segmental and somatic dysfunction of cervical region: Secondary | ICD-10-CM | POA: Diagnosis not present

## 2018-03-07 DIAGNOSIS — G44209 Tension-type headache, unspecified, not intractable: Secondary | ICD-10-CM | POA: Diagnosis not present

## 2018-03-07 DIAGNOSIS — M4602 Spinal enthesopathy, cervical region: Secondary | ICD-10-CM | POA: Diagnosis not present

## 2018-03-07 DIAGNOSIS — M9901 Segmental and somatic dysfunction of cervical region: Secondary | ICD-10-CM | POA: Diagnosis not present

## 2018-03-14 DIAGNOSIS — G44209 Tension-type headache, unspecified, not intractable: Secondary | ICD-10-CM | POA: Diagnosis not present

## 2018-03-14 DIAGNOSIS — M9901 Segmental and somatic dysfunction of cervical region: Secondary | ICD-10-CM | POA: Diagnosis not present

## 2018-03-14 DIAGNOSIS — M4602 Spinal enthesopathy, cervical region: Secondary | ICD-10-CM | POA: Diagnosis not present

## 2018-03-20 DIAGNOSIS — E785 Hyperlipidemia, unspecified: Secondary | ICD-10-CM | POA: Diagnosis not present

## 2018-03-20 LAB — LIPID PANEL
CHOLESTEROL TOTAL: 116 mg/dL (ref 100–199)
Chol/HDL Ratio: 3.2 ratio (ref 0.0–5.0)
HDL: 36 mg/dL — ABNORMAL LOW (ref >39)
LDL CALC: 53 mg/dL (ref 0–99)
TRIGLYCERIDES: 136 mg/dL (ref 0–149)
VLDL Cholesterol Cal: 27 mg/dL (ref 5–40)

## 2018-03-21 ENCOUNTER — Encounter: Payer: Self-pay | Admitting: *Deleted

## 2018-07-14 DIAGNOSIS — C61 Malignant neoplasm of prostate: Secondary | ICD-10-CM | POA: Diagnosis not present

## 2018-07-21 DIAGNOSIS — R310 Gross hematuria: Secondary | ICD-10-CM | POA: Diagnosis not present

## 2018-07-21 DIAGNOSIS — C61 Malignant neoplasm of prostate: Secondary | ICD-10-CM | POA: Diagnosis not present

## 2018-07-21 DIAGNOSIS — R300 Dysuria: Secondary | ICD-10-CM | POA: Diagnosis not present

## 2018-07-27 DIAGNOSIS — R310 Gross hematuria: Secondary | ICD-10-CM | POA: Diagnosis not present

## 2018-08-02 DIAGNOSIS — E785 Hyperlipidemia, unspecified: Secondary | ICD-10-CM | POA: Diagnosis not present

## 2018-08-02 DIAGNOSIS — J45998 Other asthma: Secondary | ICD-10-CM | POA: Diagnosis not present

## 2018-08-02 DIAGNOSIS — I10 Essential (primary) hypertension: Secondary | ICD-10-CM | POA: Diagnosis not present

## 2018-08-17 DIAGNOSIS — R310 Gross hematuria: Secondary | ICD-10-CM | POA: Diagnosis not present

## 2018-08-17 DIAGNOSIS — N329 Bladder disorder, unspecified: Secondary | ICD-10-CM | POA: Diagnosis not present

## 2018-08-17 DIAGNOSIS — R9721 Rising PSA following treatment for malignant neoplasm of prostate: Secondary | ICD-10-CM | POA: Diagnosis not present

## 2018-08-29 ENCOUNTER — Other Ambulatory Visit: Payer: Self-pay | Admitting: Urology

## 2018-09-07 ENCOUNTER — Other Ambulatory Visit: Payer: Self-pay

## 2018-09-07 ENCOUNTER — Encounter (HOSPITAL_BASED_OUTPATIENT_CLINIC_OR_DEPARTMENT_OTHER): Payer: Self-pay

## 2018-09-07 NOTE — Progress Notes (Signed)
Spoke with: Robbie NPO:  After Midnight, no gum, candy, or mints   Arrival time: 0700AM Labs: ISTAT8 AM medications: MONTELUKAST, PANTOPRAZOLE, ROSUVASTATIN, LORATADINE, BRING ASTHMA INHALER Pre op orders:  YES Ride home: [place] ([place]) [phone]

## 2018-09-07 NOTE — Progress Notes (Signed)
SPOKE W/  Eric Olsen     SCREENING SYMPTOMS OF COVID 19:   COUGH--NO  RUNNY NOSE--- NO  SORE THROAT---NO  NASAL CONGESTION----NO  SNEEZING----NO  SHORTNESS OF BREATH---NO  DIFFICULTY BREATHING---NO  TEMP >100.0 -----NO  UNEXPLAINED BODY ACHES------NO  CHILLS -------- NO  HEADACHES ---------NO  LOSS OF SMELL/ TASTE --------NO    HAVE YOU OR ANY FAMILY MEMBER TRAVELLED PAST 14 DAYS OUT OF THE   COUNTY---works in [place] STATE----NO COUNTRY----NO  HAVE YOU OR ANY FAMILY MEMBER BEEN EXPOSED TO ANYONE WITH COVID 19? NO

## 2018-09-08 ENCOUNTER — Other Ambulatory Visit (HOSPITAL_COMMUNITY)
Admission: RE | Admit: 2018-09-08 | Discharge: 2018-09-08 | Disposition: A | Discharge status: Home / Self Care | Payer: 59 | Source: Ambulatory Visit | Attending: Urology | Admitting: Urology

## 2018-09-08 DIAGNOSIS — Z1159 Encounter for screening for other viral diseases: Secondary | ICD-10-CM | POA: Insufficient documentation

## 2018-09-08 DIAGNOSIS — Z01812 Encounter for preprocedural laboratory examination: Secondary | ICD-10-CM | POA: Diagnosis present

## 2018-09-09 LAB — NOVEL CORONAVIRUS, NAA (HOSP ORDER, SEND-OUT TO REF LAB; TAT 18-24 HRS): SARS-CoV-2, NAA: NOT DETECTED

## 2018-09-11 NOTE — Progress Notes (Signed)
SPOKE W/  Patient: Eric Olsen     SCREENING SYMPTOMS OF COVID 19:   COUGH--No  RUNNY NOSE--- No  SORE THROAT---No  NASAL CONGESTION----No  SNEEZING----No  SHORTNESS OF BREATH---No  DIFFICULTY BREATHING--- No  TEMP >100.0 -----No  UNEXPLAINED BODY ACHES------No  CHILLS -------- No  HEADACHES ---------No  LOSS OF SMELL/ TASTE --------No    HAVE YOU OR ANY FAMILY MEMBER TRAVELLED PAST 14 DAYS OUT OF THE   COUNTY---No STATE----No COUNTRY----No  HAVE YOU OR ANY FAMILY MEMBER BEEN EXPOSED TO ANYONE WITH COVID 19? No  Provided patient with negative COVID-19 test results. Screening completed. Reminded patient to quarantine at home until procedure. Lyndel Pleasure, RN

## 2018-09-12 ENCOUNTER — Encounter (HOSPITAL_BASED_OUTPATIENT_CLINIC_OR_DEPARTMENT_OTHER): Payer: Self-pay

## 2018-09-12 ENCOUNTER — Encounter (HOSPITAL_BASED_OUTPATIENT_CLINIC_OR_DEPARTMENT_OTHER)
Admission: RE | Disposition: A | Discharge status: Home / Self Care | Payer: Self-pay | Source: Home / Self Care | Attending: Urology

## 2018-09-12 ENCOUNTER — Other Ambulatory Visit: Payer: Self-pay

## 2018-09-12 ENCOUNTER — Ambulatory Visit (HOSPITAL_BASED_OUTPATIENT_CLINIC_OR_DEPARTMENT_OTHER): Payer: 59 | Admitting: Anesthesiology

## 2018-09-12 ENCOUNTER — Ambulatory Visit (HOSPITAL_BASED_OUTPATIENT_CLINIC_OR_DEPARTMENT_OTHER)
Admission: RE | Admit: 2018-09-12 | Discharge: 2018-09-12 | Disposition: A | Discharge status: Home / Self Care | Payer: 59 | Attending: Urology | Admitting: Urology

## 2018-09-12 DIAGNOSIS — Z7982 Long term (current) use of aspirin: Secondary | ICD-10-CM | POA: Insufficient documentation

## 2018-09-12 DIAGNOSIS — Z9079 Acquired absence of other genital organ(s): Secondary | ICD-10-CM | POA: Diagnosis not present

## 2018-09-12 DIAGNOSIS — R310 Gross hematuria: Secondary | ICD-10-CM | POA: Diagnosis not present

## 2018-09-12 DIAGNOSIS — N329 Bladder disorder, unspecified: Secondary | ICD-10-CM | POA: Diagnosis present

## 2018-09-12 DIAGNOSIS — D303 Benign neoplasm of bladder: Secondary | ICD-10-CM | POA: Insufficient documentation

## 2018-09-12 DIAGNOSIS — Z79899 Other long term (current) drug therapy: Secondary | ICD-10-CM | POA: Insufficient documentation

## 2018-09-12 DIAGNOSIS — Z8546 Personal history of malignant neoplasm of prostate: Secondary | ICD-10-CM | POA: Insufficient documentation

## 2018-09-12 DIAGNOSIS — J45909 Unspecified asthma, uncomplicated: Secondary | ICD-10-CM | POA: Insufficient documentation

## 2018-09-12 HISTORY — DX: Fatty (change of) liver, not elsewhere classified: K76.0

## 2018-09-12 HISTORY — DX: Gastro-esophageal reflux disease without esophagitis: K21.9

## 2018-09-12 HISTORY — DX: Lesion of radial nerve, right upper limb: G56.31

## 2018-09-12 HISTORY — DX: Bladder disorder, unspecified: N32.9

## 2018-09-12 HISTORY — PX: CYSTOSCOPY WITH BIOPSY: SHX5122

## 2018-09-12 HISTORY — DX: Personal history of other diseases of the digestive system: Z87.19

## 2018-09-12 LAB — POCT I-STAT, CHEM 8
BUN: 12 mg/dL (ref 6–20)
Calcium, Ion: 1.26 mmol/L (ref 1.15–1.40)
Chloride: 106 mmol/L (ref 98–111)
Creatinine, Ser: 0.8 mg/dL (ref 0.61–1.24)
Glucose, Bld: 102 mg/dL — ABNORMAL HIGH (ref 70–99)
HCT: 48 % (ref 39.0–52.0)
Hemoglobin: 16.3 g/dL (ref 13.0–17.0)
Potassium: 4.3 mmol/L (ref 3.5–5.1)
Sodium: 141 mmol/L (ref 135–145)
TCO2: 24 mmol/L (ref 22–32)

## 2018-09-12 SURGERY — CYSTOSCOPY, WITH BIOPSY
Anesthesia: General | Site: Bladder

## 2018-09-12 MED ORDER — LIDOCAINE 2% (20 MG/ML) 5 ML SYRINGE
INTRAMUSCULAR | Status: AC
  Filled 2018-09-12: qty 5

## 2018-09-12 MED ORDER — TRAMADOL HCL 50 MG PO TABS
ORAL_TABLET | ORAL | Status: AC
  Filled 2018-09-12: qty 1

## 2018-09-12 MED ORDER — MIDAZOLAM HCL 2 MG/2ML IJ SOLN
INTRAMUSCULAR | Status: AC
  Filled 2018-09-12: qty 2

## 2018-09-12 MED ORDER — OXYBUTYNIN CHLORIDE 5 MG PO TABS
ORAL_TABLET | ORAL | Status: AC
  Filled 2018-09-12: qty 1

## 2018-09-12 MED ORDER — TRAMADOL HCL 50 MG PO TABS
50.0000 mg | ORAL_TABLET | Freq: Once | ORAL | Status: AC
  Administered 2018-09-12: 50 mg via ORAL
  Filled 2018-09-12: qty 1

## 2018-09-12 MED ORDER — ONDANSETRON HCL 4 MG/2ML IJ SOLN
INTRAMUSCULAR | Status: AC
  Filled 2018-09-12: qty 2

## 2018-09-12 MED ORDER — DEXAMETHASONE SODIUM PHOSPHATE 10 MG/ML IJ SOLN
INTRAMUSCULAR | Status: DC | PRN
  Administered 2018-09-12: 10 mg via INTRAVENOUS

## 2018-09-12 MED ORDER — OXYBUTYNIN CHLORIDE 5 MG PO TABS: 5.0000 mg | ORAL_TABLET | Freq: Three times a day (TID) | ORAL | 1 refills | Status: DC | PRN

## 2018-09-12 MED ORDER — MEPERIDINE HCL 25 MG/ML IJ SOLN
6.2500 mg | INTRAMUSCULAR | Status: DC | PRN
  Filled 2018-09-12: qty 1

## 2018-09-12 MED ORDER — ACETAMINOPHEN 160 MG/5ML PO SOLN
325.0000 mg | ORAL | Status: DC | PRN
  Filled 2018-09-12: qty 20.3

## 2018-09-12 MED ORDER — TRAMADOL HCL 50 MG PO TABS: 50.0000 mg | ORAL_TABLET | Freq: Four times a day (QID) | ORAL | 0 refills | Status: DC | PRN

## 2018-09-12 MED ORDER — FENTANYL CITRATE (PF) 100 MCG/2ML IJ SOLN
INTRAMUSCULAR | Status: AC
  Filled 2018-09-12: qty 2

## 2018-09-12 MED ORDER — OXYCODONE HCL 5 MG PO TABS
5.0000 mg | ORAL_TABLET | Freq: Once | ORAL | Status: DC | PRN
  Filled 2018-09-12: qty 1

## 2018-09-12 MED ORDER — CEFAZOLIN SODIUM-DEXTROSE 2-4 GM/100ML-% IV SOLN
INTRAVENOUS | Status: AC
  Filled 2018-09-12: qty 100

## 2018-09-12 MED ORDER — OXYCODONE HCL 5 MG/5ML PO SOLN
5.0000 mg | Freq: Once | ORAL | Status: DC | PRN
  Filled 2018-09-12: qty 5

## 2018-09-12 MED ORDER — FENTANYL CITRATE (PF) 100 MCG/2ML IJ SOLN
INTRAMUSCULAR | Status: DC | PRN
  Administered 2018-09-12 (×2): 50 ug via INTRAVENOUS

## 2018-09-12 MED ORDER — ONDANSETRON HCL 4 MG/2ML IJ SOLN
INTRAMUSCULAR | Status: DC | PRN
  Administered 2018-09-12: 4 mg via INTRAVENOUS

## 2018-09-12 MED ORDER — CEPHALEXIN 500 MG PO CAPS: 500.0000 mg | ORAL_CAPSULE | Freq: Two times a day (BID) | ORAL | 0 refills | Status: AC

## 2018-09-12 MED ORDER — ACETAMINOPHEN 325 MG PO TABS
325.0000 mg | ORAL_TABLET | ORAL | Status: DC | PRN
  Filled 2018-09-12: qty 2

## 2018-09-12 MED ORDER — PHENAZOPYRIDINE HCL 200 MG PO TABS
200.0000 mg | ORAL_TABLET | Freq: Once | ORAL | Status: AC
  Administered 2018-09-12: 200 mg via ORAL
  Filled 2018-09-12: qty 1

## 2018-09-12 MED ORDER — OXYBUTYNIN CHLORIDE 5 MG PO TABS
5.0000 mg | ORAL_TABLET | Freq: Once | ORAL | Status: AC
  Administered 2018-09-12: 5 mg via ORAL
  Filled 2018-09-12: qty 1

## 2018-09-12 MED ORDER — ONDANSETRON HCL 4 MG/2ML IJ SOLN
4.0000 mg | Freq: Once | INTRAMUSCULAR | Status: DC | PRN
  Filled 2018-09-12: qty 2

## 2018-09-12 MED ORDER — FENTANYL CITRATE (PF) 100 MCG/2ML IJ SOLN
25.0000 ug | INTRAMUSCULAR | Status: DC | PRN
  Administered 2018-09-12: 25 ug via INTRAVENOUS
  Filled 2018-09-12: qty 1

## 2018-09-12 MED ORDER — MIDAZOLAM HCL 5 MG/5ML IJ SOLN
INTRAMUSCULAR | Status: DC | PRN
  Administered 2018-09-12: 2 mg via INTRAVENOUS

## 2018-09-12 MED ORDER — PROPOFOL 10 MG/ML IV BOLUS
INTRAVENOUS | Status: DC | PRN
  Administered 2018-09-12: 200 mg via INTRAVENOUS

## 2018-09-12 MED ORDER — LACTATED RINGERS IV SOLN
INTRAVENOUS | Status: DC
  Administered 2018-09-12: 1000 mL via INTRAVENOUS
  Filled 2018-09-12: qty 1000

## 2018-09-12 MED ORDER — LIDOCAINE 2% (20 MG/ML) 5 ML SYRINGE
INTRAMUSCULAR | Status: DC | PRN
  Administered 2018-09-12: 80 mg via INTRAVENOUS

## 2018-09-12 MED ORDER — CEFAZOLIN SODIUM-DEXTROSE 2-4 GM/100ML-% IV SOLN
2.0000 g | Freq: Once | INTRAVENOUS | Status: AC
  Administered 2018-09-12: 2 g via INTRAVENOUS
  Filled 2018-09-12: qty 100

## 2018-09-12 MED ORDER — PHENAZOPYRIDINE HCL 200 MG PO TABS: 200.0000 mg | ORAL_TABLET | Freq: Three times a day (TID) | ORAL | 0 refills | Status: DC | PRN

## 2018-09-12 MED ORDER — DEXAMETHASONE SODIUM PHOSPHATE 10 MG/ML IJ SOLN
INTRAMUSCULAR | Status: AC
  Filled 2018-09-12: qty 1

## 2018-09-12 MED ORDER — PHENAZOPYRIDINE HCL 100 MG PO TABS
ORAL_TABLET | ORAL | Status: AC
  Filled 2018-09-12: qty 2

## 2018-09-12 MED ORDER — PROPOFOL 10 MG/ML IV BOLUS
INTRAVENOUS | Status: AC
  Filled 2018-09-12: qty 20

## 2018-09-12 MED ORDER — STERILE WATER FOR IRRIGATION IR SOLN
Status: DC | PRN
  Administered 2018-09-12: 3000 mL

## 2018-09-12 SURGICAL SUPPLY — 16 items
BAG DRAIN URO-CYSTO SKYTR STRL (DRAIN) ×2 IMPLANT
CATH ROBINSON RED A/P 14FR (CATHETERS) IMPLANT
CLOTH BEACON ORANGE TIMEOUT ST (SAFETY) ×2 IMPLANT
ELECT REM PT RETURN 9FT ADLT (ELECTROSURGICAL) ×2
ELECTRODE REM PT RTRN 9FT ADLT (ELECTROSURGICAL) ×1 IMPLANT
GLOVE BIO SURGEON STRL SZ7.5 (GLOVE) ×2 IMPLANT
GOWN STRL REUS W/ TWL XL LVL3 (GOWN DISPOSABLE) ×3 IMPLANT
GOWN STRL REUS W/TWL XL LVL3 (GOWN DISPOSABLE) ×1
KIT TURNOVER CYSTO (KITS) ×2 IMPLANT
MANIFOLD NEPTUNE II (INSTRUMENTS) ×2 IMPLANT
NDL HYPO 18GX1.5 BLUNT FILL (NEEDLE) IMPLANT
NEEDLE HYPO 18GX1.5 BLUNT FILL (NEEDLE) IMPLANT
PACK CYSTO (CUSTOM PROCEDURE TRAY) ×2 IMPLANT
SYR 20CC LL (SYRINGE) IMPLANT
TUBE CONNECTING 12X1/4 (SUCTIONS) ×2 IMPLANT
WATER STERILE IRR 3000ML UROMA (IV SOLUTION) ×2 IMPLANT

## 2018-09-12 NOTE — Interval H&P Note (Signed)
History and Physical Interval Note:  09/12/2018 7:22 AM  Eric Olsen  has presented today for surgery, with the diagnosis of BLADDER LESION.  The various methods of treatment have been discussed with the patient and family. After consideration of risks, benefits and other options for treatment, the patient has consented to  Procedure(s) with comments: CYSTOSCOPY WITH BLADDER / FULGURATION BIOPSY (N/A) - ONLY NEEDS 30 MIN as a surgical intervention.  The patient's history has been reviewed, patient examined, no change in status, stable for surgery.  I have reviewed the patient's chart and labs.  Questions were answered to the patient's satisfaction.     Conception Oms [place]

## 2018-09-12 NOTE — Anesthesia Procedure Notes (Signed)
Procedure Name: LMA Insertion Date/Time: 09/12/2018 8:27 AM Performed by: Bonney Aid, CRNA Pre-anesthesia Checklist: Patient identified, Emergency Drugs available, Suction available and Patient being monitored Patient Re-evaluated:Patient Re-evaluated prior to induction Oxygen Delivery Method: Circle system utilized Preoxygenation: Pre-oxygenation with 100% oxygen Induction Type: IV induction Ventilation: Mask ventilation without difficulty LMA: LMA inserted LMA Size: 5.0 Number of attempts: 1 Airway Equipment and Method: Bite block Placement Confirmation: positive ETCO2 Tube secured with: Tape Dental Injury: Teeth and Oropharynx as per pre-operative assessment

## 2018-09-12 NOTE — Discharge Instructions (Signed)

## 2018-09-12 NOTE — H&P (Signed)
PRE-OP H&P  CC: Prostate cancer and blood in the urine   HPI: Eric Olsen is a 58 year old male with a history of pT3a, Gleason 3+4 prostate adenocarcinoma s/p RALP in 2013 with subsequent PSA recurrence that was treated via aduvant EBRT in June of 2017.   PSA at diagnosis- 4.8  Last PSA- 0.20 (06/2018), 0.10 (08/2017), 0.1 (05/2017), 0.05 (09/2016).   The patient is here today for a cystoscopy to complete his evaluation after a recent episode of painless hematuria. CTU was unremarkable for any overt source of his hematuria or for any signs of prostate cancer recurrence. Denies interval UTIs, dysuria or hematuria.   CT UROGRAM 07/27/18--images viewed  IMPRESSION:  1. No explanation for hematuria. No nephrolithiasis,  ureterolithiasis, enhancing renal cortical lesion, or filling  defects within the collecting systems..  2. No bladder stones or filling defects in the bladder which does  not excluded a bladder lesion.  3. No evidence of prostate cancer recurrence.     ALLERGIES: No Allergies    MEDICATIONS: Aspirin Ec 81 mg tablet, delayed release 0 Oral  Crestor TABS Oral  Lisinopril 20 mg tablet 0 Oral  Protonix 20 mg tablet, delayed release  Singulair 10 mg tablet  Ventolin Hfa 90 mcg hfa aerosol with adapter 0 Inhalation     GU PSH: Locm 300-399Mg /Ml Iodine,1Ml - 07/27/2018      PSH Notes: Prostatectomy Robotic-Assisted, Arm Incision   NON-GU PSH: Cholecystectomy (open) - 2017    GU PMH: Gross hematuria - 07/21/2018 Rising PSA after prostate cancer treatment - 07/21/2018 Dysuria - 02/08/2018 ED following radical prostatectomy - 06/23/2017 Prostate Cancer - 2018, - 03/16/2016, - 2017, Prostate cancer, - 2017 Spermatocele (single) - 03/16/2016 ED due to arterial insufficiency, Erectile dysfunction due to arterial insufficiency - 2017 BPH w/LUTS, Benign localized prostatic hyperplasia with lower urinary tract symptoms (LUTS) - 2014 Elevated PSA, Elevated prostate specific antigen  (PSA) - 2014      PMH Notes:  Prostate cancer history:    Eric Olsen has h/o clinical stage T1c prostate cancer presents for follow up s/p recent RALP 04/28/11. PSA was minimally elevated and did not appear to be going up really rapidly prior to his diagnosis. Pretreatment PSA was 4.8. TRUS vol was 32 gram. No palpable nodules or abnormalities on DRE . TRUSP Bx 01/2011 was positive bilaterally. He did have 5/6 biopsies positive on the right and 3/6 on the left wih Gleason 3+3=6 cancer. Core involvement ranged anywhere from 5-40% per core. No additional imaging studies were indicated. No significant voiding complaints pretreatment. No prior intraabdominal surgery. Preoperative sexual functioning score is 25/25.    He is S/P RALP 04/28/11. He has had an uneventful surgery.    Pathology returned Gleason 3+4=7 adenocarcinoma of the prostate with extracapsular extension but negative margins.   pT3a stage    In the past he complained of some questionable bladder emptying issues. Postvoid residual was negligible and discontinue reported good urinary stream without incontinence.     NON-GU PMH: Encounter for general adult medical examination without abnormal findings, Encounter for preventive health examination - 2017 Asthma, Asthma - 2014 Muscle weakness (generalized), Muscle weakness - 2014 Personal history of other diseases of the circulatory system, History of hypertension - 2014 Personal history of other specified conditions, History of heartburn - 2014    FAMILY HISTORY: Family Health Status - Mother's Age - Runs In Family Family Health Status Number - Runs In Family Father Deceased At [employer] ___ - [place] In  Family Hypertension - Runs In Family   SOCIAL HISTORY: Marital Status: Married Preferred Language: English; Race: White Current Smoking Status: Patient has never smoked.  Has never drank.  Drinks 1 caffeinated drink per day. Patient's occupation is/was Works- [employer].    REVIEW OF  SYSTEMS:    GU Review Male:   Patient reports get up at night to urinate and erection problems. Patient denies frequent urination, hard to postpone urination, burning/ pain with urination, leakage of urine, stream starts and stops, trouble starting your stream, have to strain to urinate , and penile pain.  Gastrointestinal (Upper):   Patient denies nausea, vomiting, and indigestion/ heartburn.  Gastrointestinal (Lower):   Patient denies diarrhea and constipation.  Constitutional:   Patient denies fever, night sweats, weight loss, and fatigue.  Skin:   Patient denies skin rash/ lesion and itching.  Eyes:   Patient denies blurred vision and double vision.  Ears/ Nose/ Throat:   Patient denies sore throat and sinus problems.  Hematologic/Lymphatic:   Patient denies swollen glands and easy bruising.  Cardiovascular:   Patient denies chest pains and leg swelling.  Respiratory:   Patient denies cough and shortness of breath.  Endocrine:   Patient denies excessive thirst.  Musculoskeletal:   Patient denies back pain and joint pain.  Neurological:   Patient denies headaches and dizziness.  Psychologic:   Patient denies depression and anxiety.   Notes: discomfort with urination , sildenafil made pt feel flush and gave him a headache.     VITAL SIGNS:      08/17/2018 08:33 AM  Weight 225 lb / 102.06 kg  Height 71 in / 180.34 cm  BP 120/87 mmHg  Heart Rate 96 /min  Temperature 98.4 F / 36.8 C  BMI 31.4 kg/m   GU PHYSICAL EXAMINATION:    Urethral Meatus: Normal size. No lesion, no wart, no discharge, no polyp. Normal location.  Penis: Circumcised, no warts, no cracks. No dorsal Peyronie's plaques, no left corporal Peyronie's plaques, no right corporal Peyronie's plaques, no scarring, no warts. No balanitis, no meatal stenosis.  Prostate: Prostate surgically absent.   Seminal Vesicles: Prostate surgically absent.    MULTI-SYSTEM PHYSICAL EXAMINATION:    Constitutional: Well-nourished. No  physical deformities. Normally developed. Good grooming.  Neck: Neck symmetrical, not swollen. Normal tracheal position.  Skin: No paleness, no jaundice, no cyanosis. No lesion, no ulcer, no rash.  Neurologic / Psychiatric: Oriented to time, oriented to place, oriented to person. No depression, no anxiety, no agitation.  Eyes: Normal conjunctivae. Normal eyelids.  Musculoskeletal: Normal gait and station of head and neck.     PAST DATA REVIEWED:  Source Of History:  Patient   07/14/18 02/08/18 09/22/17 06/13/17 10/11/16 04/05/16 12/02/15 05/27/15  PSA  Total PSA 0.20 ng/mL 0.19 ng/mL 0.10 ng/mL 0.100 ng/mL 0.051 ng/dL 0.034 ng/dl 0.03 ng/dl 0.08     PROCEDURES:         Flexible Cystoscopy - 52000  Risks, benefits, and some of the potential complications of the procedure were discussed at length with the patient including infection, bleeding, voiding discomfort, urinary retention, fever, chills, sepsis, and others. All questions were answered. Informed consent was obtained. Antibiotic prophylaxis was given. Sterile technique and intraurethral analgesia were used.  Meatus:  Normal size. Normal location. Normal condition.  Urethra:  No strictures.  External Sphincter:  Normal.  Verumontanum:  Verumontanum Surgically Absent.  Prostate:  Prostate Surgically Absent.  Bladder Neck:  Non-obstructing.  Ureteral Orifices:  Normal location. Normal size. Normal  shape. Effluxed clear urine.  Bladder:  No trabeculation. Cystoscopy revealed a hemangioma-like lesion (5 mm) involving the left lateral wall of the bladder. No papillary lesions or other mucosal lesions noted. No stones.      The lower urinary tract was carefully examined. The procedure was well-tolerated and without complications. Antibiotic instructions were given. Instructions were given to call the office immediately for bloody urine, difficulty urinating, urinary retention, painful or frequent urination, fever, chills, nausea, vomiting  or other illness. The patient stated that he understood these instructions and would comply with them.         Urinalysis Dipstick Dipstick Cont'd  Color: Yellow Bilirubin: Neg mg/dL  Appearance: Clear Ketones: Neg mg/dL  Specific Gravity: 1.025 Blood: Neg ery/uL  pH: <=5.0 Protein: Neg mg/dL  Glucose: Neg mg/dL Urobilinogen: 0.2 mg/dL    Nitrites: Neg    Leukocyte Esterase: Neg leu/uL    ASSESSMENT:      ICD-10 Details  1 GU:   Rising PSA after prostate cancer treatment - R97.21   2   Bladder disorder, Unspec - N32.9    PLAN:            Medications Stop Meds: Uribel 118 mg-10 mg-40.8 mg-36 mg-0.12 mg capsule 1 capsule PO Q 8 H PRN  Start: 02/08/2018  Discontinue: 08/17/2018  - Reason: The medication cycle was completed.            Schedule Return Visit/Planned Activity: 1 Month - Schedule Surgery          Document Letter(s):  Created for Patient: Clinical Summary   Created for Eric Olsen         Notes:   -Cystoscopy revealed a hemangioma-like lesion (5 mm) involving the left lateral wall of the bladder. No papillary lesions or other mucosal lesions noted.  -The risks, benefits and alternatives of cystoscopy with bladder biopsy and fulguration was discussed with the patient. The risks include, but are not limited to, bleeding, urinary tract infection, bladder perforation requiring prolonged catheterization and/or open bladder repair, ureteral obstruction, voiding dysfunction and the inherent risks of general anesthesia. The patient voices understanding and wishes to proceed.   -Refer to APC to further monitor his slowly rising PSA

## 2018-09-12 NOTE — Anesthesia Postprocedure Evaluation (Signed)
Anesthesia Post Note  Patient: Eric Olsen  Procedure(s) Performed: CYSTOSCOPY WITH BLADDER / FULGURATION BIOPSY (N/A Bladder)     Patient location during evaluation: Phase II Anesthesia Type: General Level of consciousness: awake and sedated Pain management: pain level controlled Respiratory status: spontaneous breathing Cardiovascular status: stable Postop Assessment: no apparent nausea or vomiting Anesthetic complications: no    Last Vitals:  Vitals:   09/12/18 0930 09/12/18 0945  BP: 122/88 (!) 132/94  Pulse: 77 76  Resp: 12 14  Temp:    SpO2: 99% 97%    Last Pain:  Vitals:   09/12/18 0945  TempSrc:   PainSc: 4    Pain Goal: Patients Stated Pain Goal: 6 (09/12/18 0733)                 Huston Foley

## 2018-09-12 NOTE — Anesthesia Preprocedure Evaluation (Signed)
Anesthesia Evaluation   Patient awake    Reviewed: Allergy & Precautions, NPO status , Patient's Chart, lab work & pertinent test results  History of Anesthesia Complications (+) PONVNegative for: history of anesthetic complications  Airway Mallampati: II  TM Distance: >3 FB Neck ROM: Full    Dental  (+) Caps, Dental Advisory Given   Pulmonary asthma ,    breath sounds clear to auscultation       Cardiovascular hypertension, Pt. on medications (-) angina+ Peripheral Vascular Disease and + DVT   Rhythm:Regular Rate:Normal  '09 ECHO: EF=>55%, borderline conc LVH, trace MR/TR '06 Cath: normal coronaries   Neuro/Psych    GI/Hepatic Neg liver ROS, GERD  Medicated,N/v with acute chole   Endo/Other  negative endocrine ROS  Renal/GU negative Renal ROS     Musculoskeletal   Abdominal   Peds  Hematology   Anesthesia Other Findings   Reproductive/Obstetrics                             Anesthesia Physical  Anesthesia Plan  ASA: II  Anesthesia Plan: General   Post-op Pain Management:    Induction: Intravenous and Rapid sequence  PONV Risk Score and Plan: 1 and Ondansetron  Airway Management Planned: Oral ETT and LMA  Additional Equipment:   Intra-op Plan:   Post-operative Plan: Extubation in OR  Informed Consent: I have reviewed the patients History and Physical, chart, labs and discussed the procedure including the risks, benefits and alternatives for the proposed anesthesia with the patient or authorized representative who has indicated his/her understanding and acceptance.     Dental advisory given  Plan Discussed with: CRNA and Surgeon  Anesthesia Plan Comments:         Anesthesia Quick Evaluation

## 2018-09-12 NOTE — Transfer of Care (Signed)
Immediate Anesthesia Transfer of Care Note  Patient: Eric Olsen  Procedure(s) Performed: CYSTOSCOPY WITH BLADDER / FULGURATION BIOPSY (N/A Bladder)  Patient Location: PACU  Anesthesia Type:General  Level of Consciousness: drowsy  Airway & Oxygen Therapy: Patient Spontanous Breathing and Patient connected to nasal cannula oxygen  Post-op Assessment: Report given to RN  Post vital signs: Reviewed and stable  Last Vitals: 138/87 Vitals Value Taken Time  BP    Temp    Pulse 99 09/12/18 0851  Resp 10 09/12/18 0851  SpO2 98 % 09/12/18 0851  Vitals shown include unvalidated device data.  Last Pain:  Vitals:   09/12/18 0733  TempSrc:   PainSc: 0-No pain      Patients Stated Pain Goal: 6 (94/76/54 [id number])  Complications: No apparent anesthesia complications

## 2018-09-12 NOTE — Op Note (Signed)
Operative Note  Preoperative diagnosis:  1.  5 mm raised and erythematous mucosal lesion involving the left lateral wall of the bladder  Postoperative diagnosis: 1.  5 mm raised and erythematous mucosal lesion involving the left lateral wall of the bladder  Procedure(s): 1.  Cystoscopy with cold cup bladder biopsy and fulguration  Surgeon: Ellison Hughs, MD  Assistants:  None  Anesthesia:  General  Complications:  None  EBL: Less than 5 mL  Specimens: 1.  Bladder biopsies  Drains/Catheters: 1.  None  Intraoperative findings:   5 mm raised and erythematous mucosal lesion involving the left lateral wall of the bladder  Indication:  Eric Olsen is a 58 y.o. male with a history of Gleason 3+4 prostate adenocarcinoma s/p RALP in 2013 with subsequent PSA recurrence of aduvant EBRT in June of 2017.   He had a recent episode of painless gross hematuria and was found to have a 5 mm raised and erythematous mucosal lesion involving the left bladder wall on office cystoscopy.  His CT urogram was unremarkable.  He has been consented for the above procedures, voices understanding and wishes to proceed.  Description of procedure:  After informed consent was obtained, the patient was brought to the operating room and general LMA anesthesia was administered. The patient was then placed in the dorsolithotomy position and prepped and draped in the usual sterile fashion. A timeout was performed. A 23 French rigid cystoscope was then inserted into the urethral meatus and advanced into the bladder under direct vision. A complete bladder survey revealed the findings listed above with no other concerning lesions.  I then took cold cup biopsies of the lesion involving the left lateral wall of the bladder.  I was able to see detrusor muscle following cold cup biopsy.  I then fulgurated the area until hemostasis was achieved.  The patient's bladder was drained.  He tolerated the procedure well and  was transferred to the postanesthesia in stable condition.   Plan: Discharge home.  Follow-up on 09/21/2018 to discuss pathology results.

## 2018-09-13 ENCOUNTER — Encounter (HOSPITAL_BASED_OUTPATIENT_CLINIC_OR_DEPARTMENT_OTHER): Payer: Self-pay | Admitting: Urology

## 2018-10-09 ENCOUNTER — Telehealth: Payer: Self-pay | Admitting: Internal Medicine

## 2018-10-09 DIAGNOSIS — I10 Essential (primary) hypertension: Secondary | ICD-10-CM

## 2018-10-09 DIAGNOSIS — E785 Hyperlipidemia, unspecified: Secondary | ICD-10-CM

## 2018-10-09 NOTE — Telephone Encounter (Signed)
New Message      Patient calling to see if he needs to have labs done before his appt on 01/16/19.

## 2018-10-10 NOTE — Telephone Encounter (Signed)
Called patient and informed him to have fasting labs drawn before his appointment. Orders in. No further questions.

## 2018-10-10 NOTE — Telephone Encounter (Signed)
Yes, he can get a fasting lipid profile and CMET. Please order.  Dr. Lemmie Evens

## 2019-01-16 ENCOUNTER — Ambulatory Visit: Payer: 59 | Admitting: Internal Medicine

## 2019-02-02 LAB — COMPREHENSIVE METABOLIC PANEL
ALT: 20 IU/L (ref 0–44)
AST: 29 IU/L (ref 0–40)
Albumin/Globulin Ratio: 1.5 (ref 1.2–2.2)
Albumin: 4.2 g/dL (ref 3.8–4.9)
Alkaline Phosphatase: 60 IU/L (ref 39–117)
BUN/Creatinine Ratio: 12 (ref 9–20)
BUN: 12 mg/dL (ref 6–24)
Bilirubin Total: 0.5 mg/dL (ref 0.0–1.2)
CO2: 24 mmol/L (ref 20–29)
Calcium: 9.4 mg/dL (ref 8.7–10.2)
Chloride: 99 mmol/L (ref 96–106)
Creatinine, Ser: 0.98 mg/dL (ref 0.76–1.27)
GFR calc Af Amer: 99 mL/min/{1.73_m2} (ref >59)
GFR calc non Af Amer: 85 mL/min/{1.73_m2} (ref >59)
Globulin, Total: 2.8 g/dL (ref 1.5–4.5)
Glucose: 79 mg/dL (ref 65–99)
Potassium: 4.7 mmol/L (ref 3.5–5.2)
Sodium: 140 mmol/L (ref 134–144)
Total Protein: 7 g/dL (ref 6.0–8.5)

## 2019-02-02 LAB — LIPID PANEL
Chol/HDL Ratio: 3.2 ratio (ref 0.0–5.0)
Cholesterol, Total: 122 mg/dL (ref 100–199)
HDL: 38 mg/dL — ABNORMAL LOW (ref >39)
LDL Chol Calc (NIH): 59 mg/dL (ref 0–99)
Triglycerides: 143 mg/dL (ref 0–149)
VLDL Cholesterol Cal: 25 mg/dL (ref 5–40)

## 2019-02-07 ENCOUNTER — Ambulatory Visit: Payer: 59 | Admitting: Internal Medicine

## 2019-02-07 ENCOUNTER — Other Ambulatory Visit: Payer: Self-pay

## 2019-02-07 ENCOUNTER — Encounter: Payer: Self-pay | Admitting: Internal Medicine

## 2019-02-07 VITALS — BP 127/90 | HR 83 | Ht 71.0 in | Wt 231.6 lb

## 2019-02-07 DIAGNOSIS — I10 Essential (primary) hypertension: Secondary | ICD-10-CM

## 2019-02-07 DIAGNOSIS — E785 Hyperlipidemia, unspecified: Secondary | ICD-10-CM | POA: Diagnosis not present

## 2019-02-07 DIAGNOSIS — H539 Unspecified visual disturbance: Secondary | ICD-10-CM | POA: Diagnosis not present

## 2019-02-07 NOTE — Patient Instructions (Signed)
Medication Instructions:  Your physician recommends that you continue on your current medications as directed. Please refer to the Current Medication list given to you today.  *If you need a refill on your cardiac medications before your next appointment, please call your pharmacy*   Testing/Procedures: Carotid Doppler @ Dr. Lysbeth Penner office  Follow-Up: At [facility], you and your health needs are our priority.  As part of our continuing mission to provide you with exceptional heart care, we have created designated Provider Care Teams.  These Care Teams include your primary Cardiologist (physician) and Advanced Practice Providers (APPs -  Physician Assistants and Nurse Practitioners) who all work together to provide you with the care you need, when you need it.  Your next appointment:   12 months  The format for your next appointment:   Either In Person or Virtual  Provider:   Raliegh Ip Mali Hilty, MD  Other Instructions

## 2019-02-07 NOTE — Progress Notes (Signed)
OFFICE NOTE  Chief Complaint:  Visual changes  Primary Care Physician: Donald Prose, MD  HPI:  Eric Olsen  is a 58 year old gentleman who is a patient of yours who has a history of hypertension, dyslipidemia and prostate cancer which he has been cured of. He also had a history of DVT and PE in the past and was thought to be I guess due to hypercoagulable state. Overall he is doing very well. At last visit we were working on weight loss and he has managed to do that, down to 203 today from 212 pounds. Blood pressure remains well controlled at 128/84. He is exercising more often than he had been previously.   We did obtain a lipid profile on him at his last visit including particle numbers which showed an LDL particle number of 1578 despite the fact that his calculated LDL was 88 and his triglycerides were 130, total cholesterol 155 with an HDL of 41. While this appears to be a fairly normal lipid profile based on cholesterol content, the high particle number indicates excess risk. I did recommend starting Crestor 20 mg daily. He reports he's had some mild muscle pains with this and has decreased the dose to 10 mg every other day fairly recently. However he did have a recent lipid profile which shows a marked improvement. LDL particle numbers now decreased to 688, and LDL-C. is 50.  This is associated with a marked decrease in his risk for cardiovascular events.  I saw [name] back in the office today. He is doing well and denies any chest pain or shortness of breath. He is reporting some insomnia. This lead to some daytime fatigue and occasionally naps at lunch time. Unfortunately he's gained back about 20 pounds and needs to continue to work on weight loss. He says he started to do some walking at lunch and in the evenings. He had recent blood work performed in March 2016 which showed total cholesterol 102, triglycerides 108, HDL 35 and LDL 45. This is after decreasing his Crestor down to  10 mg daily.  08/12/2015  [name] returns today for follow-up. Generally seems to be doing well. He does get some fatigue but that seems to be related to radiation therapy for his prostate. He recently had a recheck of his lipid profile which shows excellent cholesterol control. Total cholesterol 94, triglycerides 128, HDL 34 and LDL 34.  08/13/2016  [name] was seen today in follow-up. He reports he's done well with cirrhosis prostate cancer. He says he's had no recurrence and is finished x-ray therapy. He is lipids are very well controlled. His recent total cluster was 101, turgor shows 137, HDL-C 30, LDL-C 44 (down from 52). He's currently on rosuvastatin 10 mg daily. He has a small amount awaking with decreased exercise reports a number of viral illnesses over the winter but is generally doing better. He's planning on starting back with more regular exercise. Denies any chest pain or worsening shortness of breath. Blood pressure is perfect today 122/84.  01/16/2018  [name] returns today in follow-up.  Over the year he is done well.  He denies any new chest pain or worsening shortness of breath.  He did start recently on Singulair as he has had some worsening asthma.  He did have childhood asthma but it apparently improved.  He has no cardiovascular disease history in fact had a heart cath in 2006 with no coronary disease.  There is family history of  early onset heart disease.  His last lipid profile 2 years ago showed excellent LDL of 34 on low-dose rosuvastatin 5 mg.  We argued whether or not he needs to be on statin medication, however he is tolerated it well and is comfortable taking it.    02/07/2019  Mr. Cake is seen today in follow-up.  Overall he is doing well without any chest pain or worsening shortness of breath.  Recent lipids show total cholesterol 122, triglycerides 143, HDL of 38 and LDL 59.  Blood pressure is reasonably well controlled at 127/90 today.  He noted that  he has had some visual changes over the last couple days.  He described some transient visual field abnormalities including waviness in his vision as well as some semicircular colorful flashes that were transient.  He says he has had this before and tends to get it in the fall associated potentially with sinus congestion.  He saw his ophthalmologist today felt that these might be ocular migraines.  [name] has no history of migraine or headache.  There apparently was no evidence of any atheroembolic disease on funduscopic exam.  PMHx:  Past Medical History:  Diagnosis Date  . Asthma    states well controlled  . Dyslipidemia   . Fatty liver   . GERD (gastroesophageal reflux disease)   . History of DVT (deep vein thrombosis)    following achilles tendon surgery  . History of gallstones   . History of hiatal hernia   . History of pulmonary embolus (PE)    following achilles tendon surgery , pt unaware  . Hypertension    cardio Dr Debara Pickett-  LOV note 2/12 on chart  . Lesion of bladder   . Peripheral vascular disease (HCC)    hx DVT 10 years ago following achilles tendon tear & PE-  last anticoagulation years ago  . PONV (postoperative nausea and vomiting)    after radial nerve surgery only  . Prostate cancer ([place])   . Radial tunnel syndrome, right   . Recurrent upper respiratory infection (URI)    recent head cold- was on augumentin- states no fever- states resolved    Past Surgical History:  Procedure Laterality Date  . CARDIAC CATHETERIZATION  03/16/2005   RCA is large & dominant, no lesions, EF 60% (Dr. Domenic Moras)  . CHOLECYSTECTOMY N/A 01/27/2016   Procedure: LAPAROSCOPIC CHOLECYSTECTOMY WITH INTRAOPERATIVE CHOLANGIOGRAM;  Surgeon: Jackolyn Confer, MD;  Location: [facility];  Service: General;  Laterality: N/A;  . COLONOSCOPY    . CYSTOSCOPY WITH BIOPSY N/A 09/12/2018   Procedure: CYSTOSCOPY WITH BLADDER / FULGURATION BIOPSY;  Surgeon: Ceasar Mons, MD;  Location: [facility];  Service: Urology;  Laterality: N/A;  ONLY NEEDS 30 MIN  . NM MYOCAR PERF WALL MOTION  2006   persantine study - inferior wall thinning; EF 63%; low risk  . PROSTATE BIOPSY    . Radial NERVE DECOMPRESSION     right elbow- radial   . ROBOT ASSISTED LAPAROSCOPIC RADICAL PROSTATECTOMY  04/28/2011   Procedure: ROBOTIC ASSISTED LAPAROSCOPIC RADICAL PROSTATECTOMY;  Surgeon: Bernestine Amass, MD;  Location: [facility];  Service: Urology;  Laterality: N/A;  . TONSILLECTOMY    . TRANSTHORACIC ECHOCARDIOGRAM  2009   EF=>55%, borderline conc LVH, trace MR/TR  . UPPER GI ENDOSCOPY      FAMHx:  Family History  Problem Relation Age of Onset  . Heart attack Mother     SOCHx:   reports that he has never  smoked. He has never used smokeless tobacco. He reports that he does not drink alcohol or use drugs.  ALLERGIES:  No Known Allergies  ROS: Pertinent items noted in HPI and remainder of comprehensive ROS otherwise negative.  HOME MEDS: Current Outpatient Medications  Medication Sig Dispense Refill  . albuterol (PROVENTIL HFA;VENTOLIN HFA) 108 (90 BASE) MCG/ACT inhaler Inhale 2 puffs into the lungs every 6 (six) hours as needed for wheezing or shortness of breath.     . Garlic [id number] MG CAPS Take 1 capsule by mouth daily. Has stopped for procedure    . Krill Oil 350 MG CAPS Take 1 capsule by mouth daily. Has stopped for procedure    . lisinopril (PRINIVIL,ZESTRIL) 20 MG tablet Take 1 tablet (20 mg total) by mouth daily. 90 tablet 3  . loratadine (CLARITIN) 10 MG tablet Take 10 mg by mouth daily.    . montelukast (SINGULAIR) 10 MG tablet Take 10 mg by mouth every morning.   3  . Multiple Vitamin (MULTIVITAMIN) capsule Take 1 capsule by mouth daily. Has stopped for procedure    . rosuvastatin (CRESTOR) 10 MG tablet Take 0.5 tablets (5 mg total) by mouth daily. (Patient taking differently: Take 5 mg by mouth every morning. ) 90 tablet 1  . vitamin C (ASCORBIC ACID) 500 MG tablet Take  500 mg by mouth daily. Has stopped for procedure     No current facility-administered medications for this visit.     LABS/IMAGING: No results found for this or any previous visit (from the past 48 hour(s)). No results found.  VITALS: BP 127/90   Pulse 83   Ht 5\' 11"  (1.803 m)   Wt 231 lb 9.6 oz (105.1 kg)   SpO2 97%   BMI 32.30 kg/m   EXAM: General appearance: alert and no distress Neck: no carotid bruit and no JVD Lungs: clear to auscultation bilaterally Heart: regular rate and rhythm, S1, S2 normal, no murmur, click, rub or gallop Abdomen: soft, non-tender; bowel sounds normal; no masses,  no organomegaly Extremities: extremities normal, atraumatic, no cyanosis or edema Pulses: 2+ and symmetric Skin: Skin color, texture, turgor normal. No rashes or lesions Neurologic: GroPsych: Pleasanty normal Psych: Pleasant  EKG: Sinus rhythm with PVCs at 83-personally reviewed  ASSESSMENT: 1. Hypertension-controlled 2. Dyslipidemia at goal  3. History prostate cancer with recurrence- s/p XRT 4. Scintillating scotomata  PLAN: 1.   Mr. 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  Although there was no evidence of any atheroembolic disease apparently according to the ophthalmologist, I think would be reasonable to rule out any significant carotid artery disease.  We will proceed with carotid Dopplers.  Otherwise plan follow-up annually or sooner as necessary.  Pixie Casino, MD, [facility], [place] Director of the [facility] Lipid [facility]  Cardiovascular [facility] Diplomate of the American Board of Clinical Lipidology Attending Cardiologist  Direct Dial: [phone]  Fax: [phone]  Website:  [email or web address] 02/07/2019, 4:31 PM

## 2019-02-21 ENCOUNTER — Inpatient Hospital Stay (HOSPITAL_COMMUNITY): Admission: RE | Admit: 2019-02-21 | Payer: 59 | Source: Ambulatory Visit

## 2019-02-21 ENCOUNTER — Other Ambulatory Visit: Payer: Self-pay | Admitting: Internal Medicine

## 2019-03-14 ENCOUNTER — Ambulatory Visit (HOSPITAL_COMMUNITY)
Admission: RE | Admit: 2019-03-14 | Discharge: 2019-03-14 | Disposition: A | Discharge status: Home / Self Care | Payer: 59 | Source: Ambulatory Visit | Attending: Cardiology | Admitting: Cardiology

## 2019-03-14 ENCOUNTER — Other Ambulatory Visit: Payer: Self-pay | Admitting: Internal Medicine

## 2019-03-14 ENCOUNTER — Other Ambulatory Visit: Payer: Self-pay

## 2019-03-14 DIAGNOSIS — H539 Unspecified visual disturbance: Secondary | ICD-10-CM | POA: Diagnosis present

## 2019-03-16 ENCOUNTER — Other Ambulatory Visit: Payer: Self-pay | Admitting: Internal Medicine

## 2019-03-21 ENCOUNTER — Ambulatory Visit (HOSPITAL_COMMUNITY)
Admission: RE | Admit: 2019-03-21 | Discharge: 2019-03-21 | Disposition: A | Discharge status: Home / Self Care | Payer: 59 | Source: Ambulatory Visit | Attending: Cardiovascular Disease | Admitting: Cardiovascular Disease

## 2019-03-21 ENCOUNTER — Ambulatory Visit: Payer: 59 | Admitting: Cardiovascular Disease

## 2019-03-21 ENCOUNTER — Telehealth: Payer: Self-pay | Admitting: Internal Medicine

## 2019-03-21 ENCOUNTER — Encounter: Payer: Self-pay | Admitting: Cardiovascular Disease

## 2019-03-21 ENCOUNTER — Other Ambulatory Visit: Payer: Self-pay

## 2019-03-21 VITALS — BP 118/78 | HR 75 | Temp 97.2°F | Ht 70.0 in | Wt 230.0 lb

## 2019-03-21 DIAGNOSIS — M7989 Other specified soft tissue disorders: Secondary | ICD-10-CM | POA: Diagnosis present

## 2019-03-21 DIAGNOSIS — M79605 Pain in left leg: Secondary | ICD-10-CM | POA: Diagnosis present

## 2019-03-21 DIAGNOSIS — I824Z2 Acute embolism and thrombosis of unspecified deep veins of left distal lower extremity: Secondary | ICD-10-CM | POA: Diagnosis not present

## 2019-03-21 MED ORDER — APIXABAN 5 MG PO TABS: 5.0000 mg | ORAL_TABLET | Freq: Two times a day (BID) | ORAL | 2 refills | Status: DC

## 2019-03-21 NOTE — Telephone Encounter (Signed)
Patient states he has pain and swelling in left calf and behind knee. Patient has hx of blood clots in left leg and is concerned he may have another one. Patient states it feels the same as when he had one before.

## 2019-03-21 NOTE — Patient Instructions (Signed)
Medication Instructions:  START ELIQUIS:  Take 10 mg (2 tablets) twice daily for one week then,  Decrease to 5 mg twice daily  *If you need a refill on your cardiac medications before your next appointment, please call your pharmacy*  Lab Work: None ordered If you have labs (blood work) drawn today and your tests are completely normal, you will receive your results only by: Marland Kitchen MyChart Message (if you have MyChart) OR . A paper copy in the mail If you have any lab test that is abnormal or we need to change your treatment, we will call you to review the results.  Testing/Procedures: None ordered  Follow-Up: At [facility], you and your health needs are our priority.  As part of our continuing mission to provide you with exceptional heart care, we have created designated Provider Care Teams.  These Care Teams include your primary Cardiologist (physician) and Advanced Practice Providers (APPs -  Physician Assistants and Nurse Practitioners) who all work together to provide you with the care you need, when you need it.  Your next appointment:   4 week(s)  The format for your next appointment:   In Person  Provider:   You may see Dr. Debara Pickett or one of the following Advanced Practice Providers on your designated Care Team:    Almyra Deforest, PA-C  Fabian Sharp, PA-C or   Roby Lofts, [place]

## 2019-03-21 NOTE — Progress Notes (Signed)
Cardiology Office Note:    Date:  03/22/2019   ID:  Eric Olsen, DOB 02-May-1960, MRN [id number]  PCP:  Eric Prose, MD  Cardiologist:  No primary care provider on file.  Electrophysiologist:  None   Referring MD: Eric Prose, MD   Chief Complaint  Patient presents with  . Follow-up  Leg swelling  History of Present Illness:    Eric Olsen is a 58 y.o. male with a hx of hypertension, dyslipidemia, treated prostate cancer and remote DVT and pulmonary embolism in the setting of ruptured Achilles tendon (approximately 15 years ago).  For the last few days he is experienced swelling tightness in his left calf, reminiscent of symptoms he had when he had a DVT in the same extremity many years ago.  He came in for venous duplex ultrasound today which demonstrated acute thrombosis of the posterior tibial and popliteal veins.  She reports that the swelling has actually diminished a little bit of the leg does not feel as tight anymore.  Roughly 1 month ago he had a typical symptoms of COVID-19 infection.  Multiple family members tested positive, while the patient himself had negative tests.  He had shortness of breath for a couple of weeks and has persistent anosmia, from which he is still not recovered.  He has not been on anticoagulation for years, but had been taking low-dose aspirin until 2 months ago, when he stopped it due to complaints related to his hiatal hernia.  He denies cough, hemoptysis, exertional dyspnea or dyspnea at rest.  He has not had chest discomfort, either anginal or pleuritic.  He denies palpitations or syncope.  He does not have a history of GI bleeding, frequent falls or injuries.  Past Medical History:  Diagnosis Date  . Asthma    states well controlled  . Dyslipidemia   . Fatty liver   . GERD (gastroesophageal reflux disease)   . History of DVT (deep vein thrombosis)    following achilles tendon surgery  . History of gallstones   . History of hiatal  hernia   . History of pulmonary embolus (PE)    following achilles tendon surgery , pt unaware  . Hypertension    cardio Dr Debara Pickett-  LOV note 2/12 on chart  . Lesion of bladder   . Peripheral vascular disease (HCC)    hx DVT 10 years ago following achilles tendon tear & PE-  last anticoagulation years ago  . PONV (postoperative nausea and vomiting)    after radial nerve surgery only  . Prostate cancer ([place])   . Radial tunnel syndrome, right   . Recurrent upper respiratory infection (URI)    recent head cold- was on augumentin- states no fever- states resolved    Past Surgical History:  Procedure Laterality Date  . CARDIAC CATHETERIZATION  03/16/2005   RCA is large & dominant, no lesions, EF 60% (Dr. Domenic Moras)  . CHOLECYSTECTOMY N/A 01/27/2016   Procedure: LAPAROSCOPIC CHOLECYSTECTOMY WITH INTRAOPERATIVE CHOLANGIOGRAM;  Surgeon: Jackolyn Confer, MD;  Location: [facility];  Service: General;  Laterality: N/A;  . COLONOSCOPY    . CYSTOSCOPY WITH BIOPSY N/A 09/12/2018   Procedure: CYSTOSCOPY WITH BLADDER / FULGURATION BIOPSY;  Surgeon: Ceasar Mons, MD;  [facility];  Service: Urology;  Laterality: N/A;  ONLY NEEDS 30 MIN  . NM MYOCAR PERF WALL MOTION  2006   persantine study - inferior wall thinning; EF 63%; low risk  . PROSTATE BIOPSY    .  Radial NERVE DECOMPRESSION     right elbow- radial   . ROBOT ASSISTED LAPAROSCOPIC RADICAL PROSTATECTOMY  04/28/2011   Procedure: ROBOTIC ASSISTED LAPAROSCOPIC RADICAL PROSTATECTOMY;  Surgeon: Bernestine Amass, MD;  Location: [facility];  Service: Urology;  Laterality: N/A;  . TONSILLECTOMY    . TRANSTHORACIC ECHOCARDIOGRAM  2009   EF=>55%, borderline conc LVH, trace MR/TR  . UPPER GI ENDOSCOPY      Current Medications: Current Meds  Medication Sig  . albuterol (PROVENTIL HFA;VENTOLIN HFA) 108 (90 BASE) MCG/ACT inhaler Inhale 2 puffs into the lungs every 6 (six) hours as needed for wheezing or shortness of breath.     . lisinopril (ZESTRIL) 20 MG tablet TAKE 1 TABLET BY MOUTH EVERY DAY  . loratadine (CLARITIN) 10 MG tablet Take 10 mg by mouth daily.  . montelukast (SINGULAIR) 10 MG tablet Take 10 mg by mouth every morning.   . Multiple Vitamin (MULTIVITAMIN) capsule Take 1 capsule by mouth daily. Has stopped for procedure  . rosuvastatin (CRESTOR) 10 MG tablet TAKE 1/2 TABLET BY MOUTH DAILY  . vitamin C (ASCORBIC ACID) 500 MG tablet Take 500 mg by mouth daily. Has stopped for procedure     Allergies:   Patient has no known allergies.   Social History   Socioeconomic History  . Marital status: Married    Spouse name: Not on file  . Number of children: Not on file  . Years of education: Not on file  . Highest education level: Not on file  Occupational History  . Not on file  Tobacco Use  . Smoking status: Never Smoker  . Smokeless tobacco: Never Used  Substance and Sexual Activity  . Alcohol use: No  . Drug use: No  . Sexual activity: Yes  Other Topics Concern  . Not on file  Social History Narrative  . Not on file   Social Determinants of Health   Financial Resource Strain:   . Difficulty of Paying Living Expenses: Not on file  Food Insecurity:   . Worried About Charity fundraiser in the Last Year: Not on file  . Ran Out of Food in the Last Year: Not on file  Transportation Needs:   . Lack of Transportation (Medical): Not on file  . Lack of Transportation (Non-Medical): Not on file  Physical Activity:   . Days of Exercise per Week: Not on file  . Minutes of Exercise per Session: Not on file  Stress:   . Feeling of Stress : Not on file  Social Connections:   . Frequency of Communication with Friends and Family: Not on file  . Frequency of Social Gatherings with Friends and Family: Not on file  . Attends Religious Services: Not on file  . Active Member of Clubs or Organizations: Not on file  . Attends Archivist Meetings: Not on file  . Marital Status: Not on file      Family History: The patient's family history includes Heart attack in his mother.  ROS:   Please see the history of present illness.     All other systems reviewed and are negative.  EKGs/Labs/Other Studies Reviewed:    The following studies were reviewed today: Lower extremity venous duplex ultrasound  EKG:  EKG is  ordered today.  The ekg ordered today demonstrates 02/07/2019, shows normal sinus rhythm with occasional PVCs, no repolarization abnormalities  Recent Labs: 09/12/2018: Hemoglobin 16.3 02/02/2019: ALT 20; BUN 12; Creatinine, Ser 0.98; Potassium 4.7; Sodium 140  Recent Lipid  Panel    Component Value Date/Time   CHOL 122 02/02/2019 0809   CHOL 111 06/05/2013 0804   TRIG 143 02/02/2019 0809   TRIG 118 06/05/2013 0804   HDL 38 (L) 02/02/2019 0809   HDL 37 (L) 06/05/2013 0804   CHOLHDL 3.2 02/02/2019 0809   CHOLHDL 2.8 08/06/2015 0830   VLDL 26 08/06/2015 0830   LDLCALC 59 02/02/2019 0809   LDLCALC 50 06/05/2013 0804    Physical Exam:    VS:  BP 118/78   Pulse 75   Temp (!) 97.2 F (36.2 C)   Ht 5\' 10"  (1.778 m)   Wt 230 lb (104.3 kg)   SpO2 97%   BMI 33.00 kg/m     Wt Readings from Last 3 Encounters:  03/21/19 230 lb (104.3 kg)  02/07/19 231 lb 9.6 oz (105.1 kg)  09/12/18 229 lb 4.8 oz (104 kg)     GEN:  Well nourished, well developed in no acute distress HEENT: Normal NECK: No JVD; No carotid bruits LYMPHATICS: No lymphadenopathy CARDIAC: RRR, no murmurs, rubs, gallops RESPIRATORY:  Clear to auscultation without rales, wheezing or rhonchi  ABDOMEN: Soft, non-tender, non-distended MUSCULOSKELETAL: Asymmetrical 2+ edema of the left calf.  There is no cyanosis or skin tension.; No deformity  SKIN: Warm and dry NEUROLOGIC:  Alert and oriented x 3 PSYCHIATRIC:  Normal affect   ASSESSMENT:    1. DVT, lower extremity, distal, acute, left (HCC)    PLAN:    In order of problems listed above:  1. Infrapopliteal DVT: In a patient with previous  history of DVT/PE and hypercoagulable state from what is almost certainly a [id number] infection, despite negative testing.  Discussed the pros and cons of anticoagulation and started treatment with Eliquis 10 mg twice daily for 1 week followed by 5 mg twice daily.  I would recommend continued anticoagulation for at least a year, but consideration could be given to lifelong anticoagulation since this is a second event (the first occurred following an orthopedic injury, but not true immobilization or long bone fracture).  Discussed avoiding immobilization and keeping the leg elevated as much as possible.  We will bring him back for follow-up in a few weeks   Medication Adjustments/Labs and Tests Ordered: Current medicines are reviewed at length with the patient today.  Concerns regarding medicines are outlined above.  No orders of the defined types were placed in this encounter.  Meds ordered this encounter  Medications  . apixaban (ELIQUIS) 5 MG TABS tablet    Sig: Take 1 tablet (5 mg total) by mouth 2 (two) times daily.    Dispense:  60 tablet    Refill:  2    Patient Instructions  Medication Instructions:  START ELIQUIS:  Take 10 mg (2 tablets) twice daily for one week then,  Decrease to 5 mg twice daily  *If you need a refill on your cardiac medications before your next appointment, please call your pharmacy*  Lab Work: None ordered If you have labs (blood work) drawn today and your tests are completely normal, you will receive your results only by: Marland Kitchen MyChart Message (if you have MyChart) OR . A paper copy in the mail If you have any lab test that is abnormal or we need to change your treatment, we will call you to review the results.  Testing/Procedures: None ordered  Follow-Up: At [facility], you and your health needs are our priority.  As part of our continuing mission to provide you with  exceptional heart care, we have created designated Provider Care Teams.  These Care  Teams include your primary Cardiologist (physician) and Advanced Practice Providers (APPs -  Physician Assistants and Nurse Practitioners) who all work together to provide you with the care you need, when you need it.  Your next appointment:   4 week(s)  The format for your next appointment:   In Person  Provider:   You may see Dr. Debara Pickett or one of the following Advanced Practice Providers on your designated Care Team:    Almyra Deforest, PA-C  Fabian Sharp, [place] or   Roby Lofts, PA-C      Signed, Sanda Klein, MD  03/22/2019 8:22 AM    [place]

## 2019-03-21 NOTE — Telephone Encounter (Signed)
Spoke with pt who states he has pain and swelling to LLE that started mid-calf and now has extended to knee. He states he has had pain and swelling for about a week. He rates pain at 5 on scale 1-10. He states his LLE feels weak when walking and pain actually increases when sitting. LLE not warm to the touch. He states he has hx DVT. 02/07/2019 OV note also states pt has hx PE. Pt states he is a little SOB but he has hx asthma and his inhaler helps. He does state that he noticed that he is out of breath when he walks up the stairs. He states that he feels like his family had COVID-19 around the Thanksgiving holiday but he tested negative. He states he was told that if he did indeed contract COVID his SOB may remain even after recovery. He states it has been 2 weeks since 'everyone has been cleared to return to work'. He denies current flu-like/covid-19 symptoms.   Consulted DOD Dr. Sallyanne Kuster who recommends that pt present to office for venous doppler today. Spoke with pt and advised of available vascular appts. Pt requests 3:00pm appt. Spoke with Parks Neptune and informed of pt appt time request. She will add pt to schedule

## 2019-04-20 ENCOUNTER — Encounter: Payer: Self-pay | Admitting: Family Medicine

## 2019-04-20 ENCOUNTER — Telehealth: Payer: Self-pay | Admitting: *Deleted

## 2019-04-20 ENCOUNTER — Other Ambulatory Visit: Payer: Self-pay

## 2019-04-20 ENCOUNTER — Ambulatory Visit: Payer: 59 | Admitting: Family Medicine

## 2019-04-20 VITALS — BP 120/96 | HR 94 | Ht 71.0 in | Wt 232.0 lb

## 2019-04-20 DIAGNOSIS — I824Z2 Acute embolism and thrombosis of unspecified deep veins of left distal lower extremity: Secondary | ICD-10-CM | POA: Diagnosis not present

## 2019-04-20 DIAGNOSIS — Z09 Encounter for follow-up examination after completed treatment for conditions other than malignant neoplasm: Secondary | ICD-10-CM | POA: Insufficient documentation

## 2019-04-20 DIAGNOSIS — H539 Unspecified visual disturbance: Secondary | ICD-10-CM

## 2019-04-20 DIAGNOSIS — E785 Hyperlipidemia, unspecified: Secondary | ICD-10-CM

## 2019-04-20 DIAGNOSIS — I10 Essential (primary) hypertension: Secondary | ICD-10-CM

## 2019-04-20 MED ORDER — APIXABAN 5 MG PO TABS: 5.0000 mg | ORAL_TABLET | Freq: Two times a day (BID) | ORAL | 11 refills | Status: DC

## 2019-04-20 NOTE — Telephone Encounter (Signed)
Spoke with patient wife and stated patient has had eye doctor appointment Monday and his eyes have been examined. Patient wife stated that she will still ask patient once he is off work about his eyes again.

## 2019-04-20 NOTE — Patient Instructions (Signed)
Medication Instructions:  Your physician recommends that you continue on your current medications as directed. Please refer to the Current Medication list given to you today.  *If you need a refill on your cardiac medications before your next appointment, please call your pharmacy*  Lab Work: [place]   If you have labs (blood work) drawn today and your tests are completely normal, you will receive your results only by: Marland Kitchen MyChart Message (if you have MyChart) OR . A paper copy in the mail If you have any lab test that is abnormal or we need to change your treatment, we will call you to review the results.  Testing/Procedures: NONE ORDERED  TODAY   Follow-Up: At [facility], you and your health needs are our priority.  As part of our continuing mission to provide you with exceptional heart care, we have created designated Provider Care Teams.  These Care Teams include your primary Cardiologist (physician) and Advanced Practice Providers (APPs -  Physician Assistants and Nurse Practitioners) who all work together to provide you with the care you need, when you need it.  Your next appointment:   1 year(s)  The format for your next appointment:   In Person  Provider:   You may see No primary care provider on file. or one of the following Advanced Practice Providers on your designated Care Team:    Almyra Deforest, PA-C  Fabian Sharp, [place] or   Roby Lofts, [place]   Other Instructions

## 2019-04-20 NOTE — Progress Notes (Signed)
Look forward to meeting you also!

## 2019-04-20 NOTE — Progress Notes (Signed)
Thanks, Mitzi Hansen. We have not met yet, correct? When did you start with [facility]? Eric Olsen

## 2019-04-20 NOTE — Progress Notes (Signed)
Cardiology Office Note  Date: 04/20/2019   ID: Eric Olsen, DOB 11/07/1960, MRN [id number]  PCP:  No primary care provider on file.  Cardiologist:  Eric Klein, MD Electrophysiologist:  None   Chief Complaint  Patient presents with  . Follow-up    DVT    History of Present Illness: Eric Olsen is a 59 y.o. male history of hyperlipidemia, hypertension, DVT/PE, prostate cancer, nonalcoholic fatty liver disease.  Patient had recently complained of left lower leg edema that started mid calf and extended to his knee on 03/21/2019.  He also complained of some mild shortness of breath stating worse when he walks up stairs.  Recent lower venous study showed acute deep vein thrombosis involving the left popliteal vein, left posterior tibial veins,and left peroneal veins.  He had not been on anticoagulation for years but had been taking aspirin up until 2 months prior to last visit with Dr. Sallyanne Olsen.  He had stopped the aspirin due to complaints related to hiatal hernia.  Dr. Sallyanne Olsen believed DVT was associated with hypercoagulable state from Covid infection although the patient had tested negative.  However patient family members who had the Covid virus.  He was started initially on Eliquis 10 mg twice daily for 1 week followed by 5 mg twice daily.  There was consideration for lifelong anticoagulant therapy given previous history of DVT/PE  Patient recently had a cystoscopy for painless hematuria in June 2020.  A 5 mm bladder lesion involving the left lateral wall of the bladder was discovered and biopsies were taken.  Dr. Lovena Olsen fulgurated the lesion until hemostasis was achieved.  Patient denies any progressive anginal or exertional symptoms other than some mild shortness of breath on exertion, but states he has asthma so it is hard to discern if shortness of breath is coming from anything other than the history of asthma.  He denies any significant lower extremity edema.  He does  continue to complain of some mild left upper calf pain and lower thigh pain which is not bothersome.  He denies any bleeding in urine or stool.  Denies any lower extremity edema bilaterally.  Denies any stroke or TIA-like symptoms, or syncopal near syncopal symptoms.  Denies any arrhythmias.    Past Medical History:  Diagnosis Date  . Asthma    states well controlled  . Dyslipidemia   . Fatty liver   . GERD (gastroesophageal reflux disease)   . History of DVT (deep vein thrombosis)    following achilles tendon surgery  . History of gallstones   . History of hiatal hernia   . History of pulmonary embolus (PE)    following achilles tendon surgery , pt unaware  . Hypertension    cardio Dr Eric Olsen-  LOV note 2/12 on chart  . Lesion of bladder   . Peripheral vascular disease (HCC)    hx DVT 10 years ago following achilles tendon tear & PE-  last anticoagulation years ago  . PONV (postoperative nausea and vomiting)    after radial nerve surgery only  . Prostate cancer (Eric Olsen)   . Radial tunnel syndrome, right   . Recurrent upper respiratory infection (URI)    recent head cold- was on augumentin- states no fever- states resolved    Past Surgical History:  Procedure Laterality Date  . CARDIAC CATHETERIZATION  03/16/2005   RCA is large & dominant, no lesions, EF 60% (Dr. Domenic Olsen)  . CHOLECYSTECTOMY N/A 01/27/2016   Procedure: LAPAROSCOPIC CHOLECYSTECTOMY WITH INTRAOPERATIVE  CHOLANGIOGRAM;  Surgeon: Eric Confer, MD;  Location: [facility];  Service: General;  Laterality: N/A;  . COLONOSCOPY    . CYSTOSCOPY WITH BIOPSY N/A 09/12/2018   Procedure: CYSTOSCOPY WITH BLADDER / FULGURATION BIOPSY;  Surgeon: Eric Mons, MD;  Location: [facility];  Service: Urology;  Laterality: N/A;  ONLY NEEDS 30 MIN  . NM MYOCAR PERF WALL MOTION  2006   persantine study - inferior wall thinning; EF 63%; low risk  . PROSTATE BIOPSY    . Radial NERVE DECOMPRESSION     right elbow-  radial   . ROBOT ASSISTED LAPAROSCOPIC RADICAL PROSTATECTOMY  04/28/2011   Procedure: ROBOTIC ASSISTED LAPAROSCOPIC RADICAL PROSTATECTOMY;  Surgeon: Eric Amass, MD;  Location: [facility];  Service: Urology;  Laterality: N/A;  . TONSILLECTOMY    . TRANSTHORACIC ECHOCARDIOGRAM  2009   EF=>55%, borderline conc LVH, trace MR/TR  . UPPER GI ENDOSCOPY      Current Outpatient Medications  Medication Sig Dispense Refill  . albuterol (PROVENTIL HFA;VENTOLIN HFA) 108 (90 BASE) MCG/ACT inhaler Inhale 2 puffs into the lungs every 6 (six) hours as needed for wheezing or shortness of breath.     Marland Kitchen apixaban (ELIQUIS) 5 MG TABS tablet Take 1 tablet (5 mg total) by mouth 2 (two) times daily. 60 tablet 11  . lisinopril (ZESTRIL) 20 MG tablet TAKE 1 TABLET BY MOUTH EVERY DAY 30 tablet 5  . loratadine (CLARITIN) 10 MG tablet Take 10 mg by mouth daily.    . montelukast (SINGULAIR) 10 MG tablet Take 10 mg by mouth every morning.   3  . Multiple Vitamin (MULTIVITAMIN) capsule Take 1 capsule by mouth daily. Has stopped for procedure    . rosuvastatin (CRESTOR) 10 MG tablet TAKE 1/2 TABLET BY MOUTH DAILY 45 tablet 3  . vitamin C (ASCORBIC ACID) 500 MG tablet Take 500 mg by mouth daily. Has stopped for procedure     No current facility-administered medications for this visit.   Allergies:  Patient has no known allergies.   Social History: The patient  reports that he has never smoked. He has never used smokeless tobacco. He reports that he does not drink alcohol or use drugs.   Family History: The patient'Eric Olsen family history includes Heart attack in his mother.   ROS:  Please see the history of present illness. Otherwise, complete review of systems is positive for minor shortness of breath on exertion.  He states this could be attributed to his asthma.  He uses a rescue inhaler as needed.  All other systems are reviewed and negative.   Physical Exam: VS:  BP (!) 120/96   Pulse 94   Ht 5\' 11"  (1.803 m)   Wt 232  lb (105.2 kg)   SpO2 96%   BMI 32.36 kg/m , BMI Body mass index is 32.36 kg/m.  Wt Readings from Last 3 Encounters:  04/20/19 232 lb (105.2 kg)  03/21/19 230 lb (104.3 kg)  02/07/19 231 lb 9.6 oz (105.1 kg)    General: Patient appears comfortable at rest. Neck: Supple, no elevated JVP or carotid bruits, no thyromegaly. Lungs: Clear to auscultation, nonlabored breathing at rest. Cardiac: Regular rate and rhythm, no S3 or significant systolic murmur, no pericardial rub. Extremities: No pitting edema, distal pulses 2+. Skin: Warm and dry. Neuropsychiatric: Alert and oriented x3, affect grossly appropriate.  ECG:  An ECG dated 02/08/2019 was personally reviewed today and demonstrated:  Sinus rhythm with PVCs rate of 83  Recent Labwork: 09/12/2018:  Hemoglobin 16.3 02/02/2019: ALT 20; AST 29; BUN 12; Creatinine, Ser 0.98; Potassium 4.7; Sodium 140     Component Value Date/Time   CHOL 122 02/02/2019 0809   CHOL 111 06/05/2013 0804   TRIG 143 02/02/2019 0809   TRIG 118 06/05/2013 0804   HDL 38 (L) 02/02/2019 0809   HDL 37 (L) 06/05/2013 0804   CHOLHDL 3.2 02/02/2019 0809   CHOLHDL 2.8 08/06/2015 0830   VLDL 26 08/06/2015 0830   LDLCALC 59 02/02/2019 0809   LDLCALC 50 06/05/2013 0804    Other Studies Reviewed Today:  Lower Venous Study March 23, 2019; indications were complaints of leg swelling and shortness of breath Right: No evidence of deep vein thrombosis in the lower extremity. No indirect evidence of obstruction proximal to the inguinal ligament.  Today No cystic structure found in the popliteal fossa. Left: Findings consistent with acute deep vein thrombosis involving the left popliteal vein, left posterior tibial veins, and left peroneal veins. No cystic structure found in the popliteal fossa. All other veins visualized appear fully compressible and demonstrate appropriate Doppler characteristics.  Carotid Arterial Duplex Study on 03/16/2019 Indications: Visual  disturbance. One episode of flashing patterns occurring in the left eye that lasted about 20-30 minutes in November 2020. Second episode that occurred last week of red flashing going across the left eye along with cloudy blurred vision lasting 20-30 minutes. He also c/o intermittent dizziness. He denies any other cerebrovascular symptoms. Findings; Right Carotid: There was no evidence of thrombus, dissection, atherosclerotic plaque or stenosis in the cervical carotid system. Left Carotid: There was no evidence of thrombus, dissection, atherosclerotic plaque or stenosis in the cervical carotid system. Vertebrals: Bilateral vertebral arteries demonstrate antegrade flow. Subclavians: Normal flow hemodynamics were seen in bilateral subclavian arteries.  Echocardiogram Aug 26, 2007.  89  Left heart catheterization March 26, 2005   L.  Assessment and Plan:  1. DVT, lower extremity, distal, acute, left ([place])   2. Essential hypertension   3. Dyslipidemia   4. Visual disturbances    1. DVT, lower extremity, distal, acute, left ([place]) Recent left lower extremity DVT believed to be a result of Covid virus.  Patient had family members with Covid virus and was tested via PCR testing and was negative.  However Dr. Sallyanne Olsen believed he had Covid virus.  He was initially started on Eliquis 10 mg p.o. twice daily x1 week then transition to Eliquis 5 mg p.o. twice daily.  He still complaining of mild upper calf pain and lower thigh pain that is not bothersome.  He admits to some shortness of breath and thought it may have been related to Eliquis therapy.  He does have asthma and is unsure as to whether the mild dyspnea is associated with asthma or Eliquis therapy.  Continue Eliquis 5 mg p.o. twice daily.  Patient may need lifelong anticoagulant therapy given history of previous DVT/PE.  2. Essential hypertension Blood pressure today 120/96.  Continue lisinopril 20 mg daily,  3.  Dyslipidemia Currently taking Crestor 10 mg daily.  Recent lipid profile 2 months ago showed an LDL of 59.  Continue Crestor 10 mg daily.  4. Visual disturbances Patient had recently complained of visual disturbances in his left eye x 2 episodes.  The first episode consisted of flashing in his left eye in November lasting approximately 20 to 30 minutes.  A second episode with red flashing lasting approximately the same duration.  Reported some mild dizziness.  Carotid study showed no evidence of thrombosis,  dissection, significant plaque, or stenosis. May need to F/U up with ophthalmology.  Medication Adjustments/Labs and Tests Ordered: Current medicines are reviewed at length with the patient today.  Concerns regarding medicines are outlined above.    Patient Instructions  Medication Instructions:  Your physician recommends that you continue on your current medications as directed. Please refer to the Current Medication list given to you today.  *If you need a refill on your cardiac medications before your next appointment, please call your pharmacy*  Lab Work: [place]   If you have labs (blood work) drawn today and your tests are completely normal, you will receive your results only by: Marland Kitchen MyChart Message (if you have MyChart) OR . A paper copy in the mail If you have any lab test that is abnormal or we need to change your treatment, we will call you to review the results.  Testing/Procedures: NONE ORDERED  TODAY   Follow-Up: At [facility], you and your health needs are our priority.  As part of our continuing mission to provide you with exceptional heart care, we have created designated Provider Care Teams.  These Care Teams include your primary Cardiologist (physician) and Advanced Practice Providers (APPs -  Physician Assistants and Nurse Practitioners) who all work together to provide you with the care you need, when you need it.  Your next appointment:   1  year(Eric Olsen)  The format for your next appointment:   In Person  Provider:   You may see No primary care provider on file. or one of the following Advanced Practice Providers on your designated Care Team:    Almyra Deforest, PA-C  Fabian Sharp, [place] or   Roby Lofts, [place]   Other Instructions          Signed,c Levell July, NP 04/20/2019 10:30 AM    [facility] [place], [place], [place] [address] Phone: [phone]; Fax: [phone]

## 2019-07-16 ENCOUNTER — Other Ambulatory Visit: Payer: Self-pay | Admitting: Internal Medicine

## 2019-11-12 ENCOUNTER — Other Ambulatory Visit (HOSPITAL_COMMUNITY): Payer: Self-pay | Admitting: Urology

## 2019-11-12 DIAGNOSIS — R9721 Rising PSA following treatment for malignant neoplasm of prostate: Secondary | ICD-10-CM

## 2019-11-20 ENCOUNTER — Ambulatory Visit (HOSPITAL_COMMUNITY): Admission: RE | Admit: 2019-11-20 | Payer: 59 | Source: Ambulatory Visit

## 2019-11-20 ENCOUNTER — Encounter (HOSPITAL_COMMUNITY): Payer: Self-pay

## 2019-11-27 ENCOUNTER — Other Ambulatory Visit: Payer: Self-pay

## 2019-11-27 ENCOUNTER — Ambulatory Visit (HOSPITAL_COMMUNITY)
Admission: RE | Admit: 2019-11-27 | Discharge: 2019-11-27 | Disposition: A | Discharge status: Home / Self Care | Payer: 59 | Source: Ambulatory Visit | Attending: Urology | Admitting: Urology

## 2019-11-27 DIAGNOSIS — R9721 Rising PSA following treatment for malignant neoplasm of prostate: Secondary | ICD-10-CM | POA: Diagnosis present

## 2019-11-27 MED ORDER — AXUMIN (FLUCICLOVINE F 18) INJECTION
9.7000 | Freq: Once | INTRAVENOUS | Status: AC | PRN
  Administered 2019-11-27: 9.7 via INTRAVENOUS

## 2020-02-01 ENCOUNTER — Telehealth: Payer: Self-pay

## 2020-02-01 DIAGNOSIS — E785 Hyperlipidemia, unspecified: Secondary | ICD-10-CM

## 2020-02-01 DIAGNOSIS — I10 Essential (primary) hypertension: Secondary | ICD-10-CM

## 2020-02-01 LAB — LIPID PANEL
Chol/HDL Ratio: 3.4 ratio (ref 0.0–5.0)
Cholesterol, Total: 129 mg/dL (ref 100–199)
HDL: 38 mg/dL — ABNORMAL LOW (ref >39)
LDL Chol Calc (NIH): 66 mg/dL (ref 0–99)
Triglycerides: 145 mg/dL (ref 0–149)
VLDL Cholesterol Cal: 25 mg/dL (ref 5–40)

## 2020-02-01 LAB — COMPREHENSIVE METABOLIC PANEL
ALT: 25 IU/L (ref 0–44)
AST: 33 IU/L (ref 0–40)
Albumin/Globulin Ratio: 1.7 (ref 1.2–2.2)
Albumin: 4.6 g/dL (ref 3.8–4.9)
Alkaline Phosphatase: 58 IU/L (ref 44–121)
BUN/Creatinine Ratio: 13 (ref 9–20)
BUN: 12 mg/dL (ref 6–24)
Bilirubin Total: 0.7 mg/dL (ref 0.0–1.2)
CO2: 26 mmol/L (ref 20–29)
Calcium: 9.7 mg/dL (ref 8.7–10.2)
Chloride: 100 mmol/L (ref 96–106)
Creatinine, Ser: 0.96 mg/dL (ref 0.76–1.27)
GFR calc Af Amer: 100 mL/min/{1.73_m2} (ref >59)
GFR calc non Af Amer: 87 mL/min/{1.73_m2} (ref >59)
Globulin, Total: 2.7 g/dL (ref 1.5–4.5)
Glucose: 90 mg/dL (ref 65–99)
Potassium: 5.4 mmol/L — ABNORMAL HIGH (ref 3.5–5.2)
Sodium: 139 mmol/L (ref 134–144)
Total Protein: 7.3 g/dL (ref 6.0–8.5)

## 2020-02-01 NOTE — Telephone Encounter (Signed)
Patient to clinic lab for labs to be drawn prior to visit with Dr. Debara Pickett. CMP and Lipids placed, MD aware.

## 2020-02-04 ENCOUNTER — Other Ambulatory Visit: Payer: Self-pay

## 2020-02-04 DIAGNOSIS — Z79899 Other long term (current) drug therapy: Secondary | ICD-10-CM

## 2020-02-04 DIAGNOSIS — E785 Hyperlipidemia, unspecified: Secondary | ICD-10-CM

## 2020-02-04 DIAGNOSIS — I10 Essential (primary) hypertension: Secondary | ICD-10-CM

## 2020-02-08 ENCOUNTER — Encounter: Payer: Self-pay | Admitting: *Deleted

## 2020-02-08 ENCOUNTER — Other Ambulatory Visit: Payer: Self-pay

## 2020-02-08 ENCOUNTER — Encounter: Payer: Self-pay | Admitting: Internal Medicine

## 2020-02-08 ENCOUNTER — Ambulatory Visit: Payer: 59 | Admitting: Internal Medicine

## 2020-02-08 VITALS — BP 118/80 | HR 85 | Ht 71.0 in | Wt 227.8 lb

## 2020-02-08 DIAGNOSIS — I82592 Chronic embolism and thrombosis of other specified deep vein of left lower extremity: Secondary | ICD-10-CM

## 2020-02-08 DIAGNOSIS — I10 Essential (primary) hypertension: Secondary | ICD-10-CM | POA: Diagnosis not present

## 2020-02-08 DIAGNOSIS — Z79899 Other long term (current) drug therapy: Secondary | ICD-10-CM

## 2020-02-08 DIAGNOSIS — E785 Hyperlipidemia, unspecified: Secondary | ICD-10-CM

## 2020-02-08 NOTE — Patient Instructions (Signed)
Medication Instructions:  Your physician recommends that you continue on your current medications as directed. Please refer to the Current Medication list given to you today.  *If you need a refill on your cardiac medications before your next appointment, please call your pharmacy*   Lab Work: BMET today   If you have labs (blood work) drawn today and your tests are completely normal, you will receive your results only by:  [place] (if you have MyChart) OR  A paper copy in the mail If you have any lab test that is abnormal or we need to change your treatment, we will call you to review the results.   Testing/Procedures: Lower Extremity Venous Doppler - to assess DVT   Follow-Up: At [facility], you and your health needs are our priority.  As part of our continuing mission to provide you with exceptional heart care, we have created designated Provider Care Teams.  These Care Teams include your primary Cardiologist (physician) and Advanced Practice Providers (APPs -  Physician Assistants and Nurse Practitioners) who all work together to provide you with the care you need, when you need it.  We recommend signing up for the patient portal called "MyChart".  Sign up information is provided on this After Visit Summary.  MyChart is used to connect with patients for Virtual Visits (Telemedicine).  Patients are able to view lab/test results, encounter notes, upcoming appointments, etc.  Non-urgent messages can be sent to your provider as well.   To learn more about what you can do with MyChart, go to [email or web address].    Your next appointment:   12 month(s)  The format for your next appointment:   In Person  Provider:   You may see Dr. Debara Pickett or one of the following Advanced Practice Providers on your designated Care Team:    Almyra Deforest, PA-C  Fabian Sharp, [place] or   Roby Lofts, [place]    Other Instructions  Potassium Content of Foods  Potassium is a mineral  found in many foods and drinks. It affects how the heart works, and helps keep fluids and minerals balanced in the body. The amount of potassium you need each day depends on your age and any medical conditions you may have. Talk to your health care provider or dietitian about how much potassium you need.  The following lists of foods provide the general serving size for foods and the approximate amount of potassium in each serving, listed in milligrams (mg). Actual values may vary depending on the product and how it is processed.  High in potassium The following foods and beverages have 200 mg or more of potassium per serving:  Apricots (raw) - 2 have 200 mg of potassium.  Apricots (dry) - 5 have 200 mg of potassium.  Artichoke - 1 medium has 345 mg of potassium.  Avocado -  fruit has 245 mg of potassium.  Banana - 1 medium fruit has 425 mg of potassium.  [place] or baked beans (canned) -  cup has 280 mg of potassium.  White beans (canned) -  cup has 595 mg potassium.  Beef roast - 3 oz has 320 mg of potassium.  Ground beef - 3 oz has 270 mg of potassium.  Beets (raw or cooked) -  cup has 260 mg of potassium.  Bran muffin - 2 oz has 300 mg of potassium.  Broccoli (cooked) -  cup has 230 mg of potassium.  Brussels sprouts -  cup has 250 mg of potassium.  Cantaloupe -  cup has 215 mg of potassium.  Cereal, 100% bran -  cup has 200-400 mg of potassium.  Cheeseburger -1 single fast food burger has 225-400 mg of potassium.  Chicken - 3 oz has 220 mg of potassium.  Clams (canned) - 3 oz has 535 mg of potassium.  Crab - 3 oz has 225 mg of potassium.  Dates - 5 have 270 mg of potassium.  Dried beans and peas -  cup has 300-475 mg of potassium.  Figs (dried) - 2 have 260 mg of potassium.  Fish (halibut, tuna, cod, snapper) - 3 oz has 480 mg of potassium.  Fish (salmon, haddock, swordfish, perch) - 3 oz has 300 mg of potassium.  Fish (tuna, canned) - 3 oz has 200  mg of potassium.  [place] fries (fast food) - 3 oz has 470 mg of potassium.  Granola with fruit and nuts -  cup has 200 mg of potassium.  Grapefruit juice -  cup has 200 mg of potassium.  Honeydew melon -  cup has 200 mg of potassium.  Kale (raw) - 1 cup has 300 mg of potassium.  Kiwi - 1 medium fruit has 240 mg of potassium.  Kohlrabi, rutabaga, parsnips -  cup has 280 mg of potassium.  Lentils -  cup has 365 mg of potassium.  Mango - 1 each has 325 mg of potassium.  Milk (nonfat, low-fat, whole, buttermilk) - 1 cup has 350-380 mg of potassium.  Milk (chocolate) - 1 cup has 420 mg of potassium  Molasses - 1 Tbsp has 295 mg of potassium.  Mushrooms -  cup has 280 mg of potassium.  Nectarine - 1 each has 275 mg of potassium.  Nuts (almonds, peanuts, hazelnuts, [place], cashew, mixed) - 1 oz has 200 mg of potassium.  Nuts (pistachios) - 1 oz has 295 mg of potassium.  Orange - 1 fruit has 240 mg of potassium.  Orange juice -  cup has 235 mg of potassium.  Papaya -  medium fruit has 390 mg of potassium.  Peanut butter (chunky) - 2 Tbsp has 240 mg of potassium.  Peanut butter (smooth) - 2 Tbsp has 210 mg of potassium.  Pear - 1 medium (200 mg) of potassium.  Pomegranate - 1 whole fruit has 400 mg of potassium.  Pomegranate juice -  cup has 215 mg of potassium.  Pork - 3 oz has 350 mg of potassium.  Potato chips (salted) - 1 oz has 465 mg of potassium.  Potato (baked with skin) - 1 medium has 925 mg of potassium.  Potato (boiled) -  cup has 255 mg of potassium.  Potato (Mashed) -  cup has 330 mg of potassium.  Prune juice -  cup has 370 mg of potassium.  Prunes - 5 have 305 mg of potassium.  Pudding (chocolate) -  cup has 230 mg of potassium.  Pumpkin (canned) -  cup has 250 mg of potassium.  Raisins (seedless) -  cup has 270 mg of potassium.  Seeds (sunflower or pumpkin) - 1 oz has 240 mg of potassium.  Soy milk - 1 cup has 300 mg of  potassium.  Spinach (cooked) - 1/2 cup has 420 mg of potassium.  Spinach (canned) -  cup has 370 mg of potassium.  Sweet potato (baked with skin) - 1 medium has 450 mg of potassium.  Swiss chard -  cup has 480 mg of potassium.  Tomato or vegetable juice -  cup has  275 mg of potassium.  Tomato (sauce or puree) -  cup has 400-550 mg of potassium.  Tomato (raw) - 1 medium has 290 mg of potassium.  Tomato (canned) -  cup has 200-300 mg of potassium.  [place] - 3 oz has 250 mg of potassium.  Wheat germ - 1 oz has 250 mg of potassium.  Winter squash -  cup has 250 mg of potassium.  Yogurt (plain or fruited) - 6 oz has 260-435 mg of potassium.  Zucchini -  cup has 220 mg of potassium.  Moderate in potassium The following foods and beverages have 50-200 mg of potassium per serving:  Apple - 1 fruit has 150 mg of potassium  Apple juice -  cup has 150 mg of potassium  Applesauce -  cup has 90 mg of potassium  Apricot nectar -  cup has 140 mg of potassium  Asparagus (small spears) -  cup has 155 mg of potassium  Asparagus (large spears) - 6 have 155 mg of potassium  Bagel (cinnamon raisin) - 1 four-inch bagel has 130 mg of potassium  Bagel (egg or plain) - 1 four- inch bagel has 70 mg of potassium  Beans (green) -  cup has 90 mg of potassium  Beans (yellow) -  cup has 190 mg of potassium  Beer, regular - 12 oz has 100 mg of potassium  Beets (canned) -  cup has 125 mg of potassium  Blackberries -  cup has 115 mg of potassium  Blueberries -  cup has 60 mg of potassium  Bread (whole wheat) - 1 slice has 70 mg of potassium  Broccoli (raw) -  cup has 145 mg of potassium  Cabbage -  cup has 150 mg of potassium  Carrots (cooked or raw) -  cup has 180 mg of potassium  Cauliflower (raw) -  cup has 150 mg of potassium  Celery (raw) -  cup has 155 mg of potassium  Cereal, bran flakes -  cup has 120-150 mg of potassium  Cheese (cottage) -  cup  has 110 mg of potassium  Cherries - 10 have 150 mg of potassium  Chocolate - 1 oz bar has 165 mg of potassium  Coffee (brewed) - 6 oz has 90 mg of potassium  Corn -  cup or 1 ear has 195 mg of potassium  Cucumbers -  cup has 80 mg of potassium  Egg - 1 large egg has 60 mg of potassium  Eggplant -  cup has 60 mg of potassium  Endive (raw) -  cup has 80 mg of potassium  English muffin - 1 has 65 mg of potassium  Fish (ocean perch) - 3 oz has 192 mg of potassium  Frankfurter, beef or pork - 1 has 75 mg of potassium  Fruit cocktail -  cup has 115 mg of potassium  Grape juice -  cup has 170 mg of potassium  Grapefruit -  fruit has 175 mg of potassium  Grapes -  cup has 155 mg of potassium  Greens: kale, turnip, collard -  cup has 110-150 mg of potassium  Ice cream or frozen yogurt (chocolate) -  cup has 175 mg of potassium  Ice cream or frozen yogurt (vanilla) -  cup has 120-150 mg of potassium  Lemons, limes - 1 each has 80 mg of potassium  Lettuce - 1 cup has 100 mg of potassium  Mixed vegetables -  cup has 150 mg of potassium  Mushrooms, raw -  cup has 110 mg of potassium  Nuts (walnuts, pecans, or macadamia) - 1 oz has 125 mg of potassium  Oatmeal -  cup has 80 mg of potassium  Okra -  cup has 110 mg of potassium  Onions -  cup has 120 mg of potassium  Peach - 1 has 185 mg of potassium  Peaches (canned) -  cup has 120 mg of potassium  Pears (canned) -  cup has 120 mg of potassium  Peas, green (frozen) -  cup has 90 mg of potassium  Peppers (Green) -  cup has 130 mg of potassium  Peppers (Red) -  cup has 160 mg of potassium  Pineapple juice -  cup has 165 mg of potassium  Pineapple (fresh or canned) -  cup has 100 mg of potassium  Plums - 1 has 105 mg of potassium  Pudding, vanilla -  cup has 150 mg of potassium  Raspberries -  cup has 90 mg of potassium  Rhubarb -  cup has 115 mg of potassium  Rice, wild -  cup  has 80 mg of potassium  Shrimp - 3 oz has 155 mg of potassium  Spinach (raw) - 1 cup has 170 mg of potassium  Strawberries -  cup has 125 mg of potassium  Summer squash -  cup has 175-200 mg of potassium  Swiss chard (raw) - 1 cup has 135 mg of potassium  Tangerines - 1 fruit has 140 mg of potassium  Tea, brewed - 6 oz has 65 mg of potassium  Turnips -  cup has 140 mg of potassium  Watermelon -  cup has 85 mg of potassium  Wine (Red, table) - 5 oz has 180 mg of potassium  Wine (White, table) - 5 oz 100 mg of potassium  Low in potassium The following foods and beverages have less than 50 mg of potassium per serving.  Bread (white) - 1 slice has 30 mg of potassium  Carbonated beverages - 12 oz has less than 5 mg of potassium  Cheese - 1 oz has 20-30 mg of potassium  Cranberries -  cup has 45 mg of potassium  Cranberry juice cocktail -  cup has 20 mg of potassium  Fats and oils - 1 Tbsp has less than 5 mg of potassium  Hummus - 1 Tbsp has 32 mg of potassium  Nectar (papaya, mango, or pear) -  cup has 35 mg of potassium  Rice (white or brown) -  cup has 50 mg of potassium  Spaghetti or macaroni (cooked) -  cup has 30 mg of potassium  Tortilla, flour or corn - 1 has 50 mg of potassium  Waffle - 1 four-inch waffle has 50 mg of potassium  Water chestnuts -  cup has 40 mg of potassium  Summary  Potassium is a mineral found in many foods and drinks. It affects how the heart works, and helps keep fluids and minerals balanced in the body.  The amount of potassium you need each day depends on your age and any existing medical conditions you may have. Your health care provider or dietitian may recommend an amount of potassium that you should have each day. This information is not intended to replace advice given to you by your health care provider. Make sure you discuss any questions you have with your health care provider. Document Revised: 02/25/2017 Document  Reviewed: 06/09/2016 Elsevier Patient Education  [place].

## 2020-02-08 NOTE — Progress Notes (Signed)
OFFICE NOTE  Chief Complaint:  Follow-up   Primary Care Physician: Donald Prose, MD  HPI:  Eric Olsen  is a 59 year old gentleman who is a patient of yours who has a history of hypertension, dyslipidemia and prostate cancer which he has been cured of. He also had a history of DVT and PE in the past and was thought to be I guess due to hypercoagulable state. Overall he is doing very well. At last visit we were working on weight loss and he has managed to do that, down to 203 today from 212 pounds. Blood pressure remains well controlled at 128/84. He is exercising more often than he had been previously.   We did obtain a lipid profile on him at his last visit including particle numbers which showed an LDL particle number of 1578 despite the fact that his calculated LDL was 88 and his triglycerides were 130, total cholesterol 155 with an HDL of 41. While this appears to be a fairly normal lipid profile based on cholesterol content, the high particle number indicates excess risk. I did recommend starting Crestor 20 mg daily. He reports he's had some mild muscle pains with this and has decreased the dose to 10 mg every other day fairly recently. However he did have a recent lipid profile which shows a marked improvement. LDL particle numbers now decreased to 688, and LDL-C. is 50.  This is associated with a marked decrease in his risk for cardiovascular events.  I saw Eric Olsen back in the office today. He is doing well and denies any chest pain or shortness of breath. He is reporting some insomnia. This lead to some daytime fatigue and occasionally naps at lunch time. Unfortunately he's gained back about 20 pounds and needs to continue to work on weight loss. He says he started to do some walking at lunch and in the evenings. He had recent blood work performed in March 2016 which showed total cholesterol 102, triglycerides 108, HDL 35 and LDL 45. This is after decreasing his Crestor down to 10  mg daily.  08/12/2015  Eric Olsen returns today for follow-up. Generally seems to be doing well. He does get some fatigue but that seems to be related to radiation therapy for his prostate. He recently had a recheck of his lipid profile which shows excellent cholesterol control. Total cholesterol 94, triglycerides 128, HDL 34 and LDL 34.  08/13/2016  Eric Olsen was seen today in follow-up. He reports he's done well with cirrhosis prostate cancer. He says he's had no recurrence and is finished x-ray therapy. He is lipids are very well controlled. His recent total cluster was 101, turgor shows 137, HDL-C 30, LDL-C 44 (down from 52). He's currently on rosuvastatin 10 mg daily. He has a small amount awaking with decreased exercise reports a number of viral illnesses over the winter but is generally doing better. He's planning on starting back with more regular exercise. Denies any chest pain or worsening shortness of breath. Blood pressure is perfect today 122/84.  01/16/2018  Eric Olsen returns today in follow-up.  Over the year he is done well.  He denies any new chest pain or worsening shortness of breath.  He did start recently on Singulair as he has had some worsening asthma.  He did have childhood asthma but it apparently improved.  He has no cardiovascular disease history in fact had a heart cath in 2006 with no coronary disease.  There is family history of  early onset heart disease.  His last lipid profile 2 years ago showed excellent LDL of 34 on low-dose rosuvastatin 5 mg.  We argued whether or not he needs to be on statin medication, however he is tolerated it well and is comfortable taking it.    02/07/2019  Eric Olsen is seen today in follow-up.  Overall he is doing well without any chest pain or worsening shortness of breath.  Recent lipids show total cholesterol 122, triglycerides 143, HDL of 38 and LDL 59.  Blood pressure is reasonably well controlled at 127/90 today.  He noted that he  has had some visual changes over the last couple days.  He described some transient visual field abnormalities including waviness in his vision as well as some semicircular colorful flashes that were transient.  He says he has had this before and tends to get it in the fall associated potentially with sinus congestion.  He saw his ophthalmologist today felt that these might be ocular migraines.  Eric Olsen has no history of migraine or headache.  There apparently was no evidence of any atheroembolic disease on funduscopic exam.   02/08/2020  Eric Olsen is seen today in follow-up. He was seen by one of my partners in December last year with acute leg pain and swelling in the left leg. He had previously been exposed to COVID-19 over the holidays from family members. Although he did not test positive he did lose his sense of taste and smell. Subsequently he underwent ultrasound evaluation here which demonstrated infrapopliteal DVT presumably from a hypercoagulable state. Incidentally he was also recently noted to have increasing PSA suggesting a recurrence of prostate cancer. He had had remote prostatectomy and radiation and more recently has started on antiandrogen therapy. He tells me that he had a remote DVT/PE in the past which was thought to be due to an Achilles tendon surgery. He was placed on Eliquis 5 mg twice daily and advised that he may need to be on it for a year. At this point it has been almost a year since he has been on therapy. He reports no significant edema but some occasional tightness in his left calf. He did have recent lab work which showed mildly elevated potassium of 5.4, normal renal function with creatinine 0.96. Total cholesterol 129, HDL 38, LDL 66 and triglycerides 145.  PMHx:  Past Medical History:  Diagnosis Date   Asthma    states well controlled   Dyslipidemia    Fatty liver    GERD (gastroesophageal reflux disease)    History of DVT (deep vein thrombosis)     following achilles tendon surgery   History of gallstones    History of hiatal hernia    History of pulmonary embolus (PE)    following achilles tendon surgery , pt unaware   Hypertension    cardio Dr Debara Pickett-  LOV note 2/12 on chart   Lesion of bladder    Peripheral vascular disease ([place])    hx DVT 10 years ago following achilles tendon tear & PE-  last anticoagulation years ago   PONV (postoperative nausea and vomiting)    after radial nerve surgery only   Prostate cancer (HCC)    Radial tunnel syndrome, right    Recurrent upper respiratory infection (URI)    recent head cold- was on augumentin- states no fever- states resolved    Past Surgical History:  Procedure Laterality Date   CARDIAC CATHETERIZATION  03/16/2005   RCA is large &  dominant, no lesions, EF 60% (Dr. Domenic Moras)   CHOLECYSTECTOMY N/A 01/27/2016   Procedure: LAPAROSCOPIC CHOLECYSTECTOMY WITH INTRAOPERATIVE CHOLANGIOGRAM;  Surgeon: Jackolyn Confer, MD;  Location: [facility];  Service: General;  Laterality: N/A;   COLONOSCOPY     CYSTOSCOPY WITH BIOPSY N/A 09/12/2018   Procedure: CYSTOSCOPY WITH BLADDER / FULGURATION BIOPSY;  Surgeon: Ceasar Mons, MD;  Location: [facility];  Service: Urology;  Laterality: N/A;  ONLY NEEDS 30 MIN   NM MYOCAR PERF WALL MOTION  2006   persantine study - inferior wall thinning; EF 63%; low risk   PROSTATE BIOPSY     Radial NERVE DECOMPRESSION     right elbow- radial    ROBOT ASSISTED LAPAROSCOPIC RADICAL PROSTATECTOMY  04/28/2011   Procedure: ROBOTIC ASSISTED LAPAROSCOPIC RADICAL PROSTATECTOMY;  Surgeon: Bernestine Amass, MD;  Location: [facility];  Service: Urology;  Laterality: N/A;   TONSILLECTOMY     TRANSTHORACIC ECHOCARDIOGRAM  2009   EF=>55%, borderline conc LVH, trace MR/TR   UPPER GI ENDOSCOPY      FAMHx:  Family History  Problem Relation Age of Onset   Heart attack Mother     SOCHx:   reports that he has never smoked. He has  never used smokeless tobacco. He reports that he does not drink alcohol and does not use drugs.  ALLERGIES:  No Known Allergies  ROS: Pertinent items noted in HPI and remainder of comprehensive ROS otherwise negative.  HOME MEDS: Current Outpatient Medications  Medication Sig Dispense Refill   albuterol (PROVENTIL HFA;VENTOLIN HFA) 108 (90 BASE) MCG/ACT inhaler Inhale 2 puffs into the lungs every 6 (six) hours as needed for wheezing or shortness of breath.      apixaban (ELIQUIS) 5 MG TABS tablet Take 1 tablet (5 mg total) by mouth 2 (two) times daily. 60 tablet 11   aspirin EC 81 MG tablet Take 81 mg by mouth daily. Swallow whole.     bicalutamide (CASODEX) 50 MG tablet Take 50 mg by mouth daily.     lisinopril (ZESTRIL) 20 MG tablet TAKE 1 TABLET BY MOUTH EVERY DAY 30 tablet 8   montelukast (SINGULAIR) 10 MG tablet Take 10 mg by mouth every morning.   3   Multiple Vitamin (MULTIVITAMIN) capsule Take 1 capsule by mouth daily. Has stopped for procedure     rosuvastatin (CRESTOR) 10 MG tablet TAKE 1/2 TABLET BY MOUTH DAILY 45 tablet 3   vitamin C (ASCORBIC ACID) 500 MG tablet Take 500 mg by mouth daily. Has stopped for procedure     loratadine (CLARITIN) 10 MG tablet Take 10 mg by mouth daily. (Patient not taking: Reported on 02/08/2020)     No current facility-administered medications for this visit.    LABS/IMAGING: No results found for this or any previous visit (from the past 48 hour(s)). No results found.  VITALS: BP 118/80 (BP Location: Left Arm, Patient Position: Sitting)    Pulse 85    Ht 5\' 11"  (1.803 m)    Wt 227 lb 12.8 oz (103.3 kg)    SpO2 94%    BMI 31.77 kg/m   EXAM: General appearance: alert and no distress Neck: no carotid bruit and no JVD Lungs: clear to auscultation bilaterally Heart: regular rate and rhythm, S1, S2 normal, no murmur, click, rub or gallop Abdomen: soft, non-tender; bowel sounds normal; no masses,  no organomegaly Extremities:  extremities normal, atraumatic, no cyanosis or edema Pulses: 2+ and symmetric Skin: Skin color, texture, turgor normal. No rashes  or lesions Neurologic: GroPsych: Pleasanty normal Psych: Pleasant  EKG: Normal sinus rhythm 85-personally reviewed  ASSESSMENT: 1. Recent left infrapopliteal DVT, on Eliquis (remote DVT/PE after Achilles tendon rupture) 2. Hypertension 3. Dyslipidemi 4. History prostate cancer with recurrence- s/p XRT, now on antiandrogen therapy 5. Scintillating scotomata  PLAN: 1.   Mr. Bazzi had recent recurrence of infrapopliteal DVT, around the time of a COVID-19 exposure and he has had recurrent prostate cancer, both possible risk factors for hypercoagulable state. He also had a history of remote DVT and PE after an Achilles tendon rupture. Since he has had now 2 occurrences, guidelines recommend lifelong anticoagulation. I would like to demonstrate resolution of his DVT as consideration would be for switching him from Eliquis to low-dose Xarelto 10 mg daily for prevention of DVT/PE recurrence. We will repeat a metabolic profile to reassess potassium and had advised dietary reduction of potassium. If this is not improving we may need to decrease his ACE inhibitor or discontinue it.  Follow-up with me annually or sooner as necessary.  Pixie Casino, MD, [facility], [place] Director of the [facility] Lipid Disorders &  Cardiovascular Risk Reduction [facility] Diplomate of the American Board of Clinical Lipidology Attending Cardiologist  Direct Dial: [phone]   Fax: [phone]  Website:  [email or web address] 02/08/2020, 8:03 AM

## 2020-02-12 ENCOUNTER — Other Ambulatory Visit: Payer: Self-pay

## 2020-02-12 ENCOUNTER — Ambulatory Visit (HOSPITAL_COMMUNITY)
Admission: RE | Admit: 2020-02-12 | Discharge: 2020-02-12 | Disposition: A | Discharge status: Home / Self Care | Payer: 59 | Source: Ambulatory Visit | Attending: Cardiovascular Disease | Admitting: Cardiovascular Disease

## 2020-02-12 DIAGNOSIS — I82592 Chronic embolism and thrombosis of other specified deep vein of left lower extremity: Secondary | ICD-10-CM | POA: Diagnosis present

## 2020-02-12 LAB — BASIC METABOLIC PANEL
BUN/Creatinine Ratio: 16 (ref 9–20)
BUN: 15 mg/dL (ref 6–24)
CO2: 27 mmol/L (ref 20–29)
Calcium: 9.3 mg/dL (ref 8.7–10.2)
Chloride: 103 mmol/L (ref 96–106)
Creatinine, Ser: 0.92 mg/dL (ref 0.76–1.27)
GFR calc Af Amer: 106 mL/min/{1.73_m2} (ref >59)
GFR calc non Af Amer: 91 mL/min/{1.73_m2} (ref >59)
Glucose: 93 mg/dL (ref 65–99)
Potassium: 4.6 mmol/L (ref 3.5–5.2)
Sodium: 140 mmol/L (ref 134–144)

## 2020-02-14 ENCOUNTER — Other Ambulatory Visit: Payer: Self-pay | Admitting: *Deleted

## 2020-02-14 MED ORDER — RIVAROXABAN 10 MG PO TABS: 10.0000 mg | ORAL_TABLET | Freq: Every day | ORAL | 3 refills | Status: DC

## 2020-02-29 ENCOUNTER — Telehealth: Payer: Self-pay

## 2020-02-29 DIAGNOSIS — Z01818 Encounter for other preprocedural examination: Secondary | ICD-10-CM

## 2020-02-29 NOTE — Telephone Encounter (Signed)
Forwarded to requesting providers via [employer]

## 2020-02-29 NOTE — Telephone Encounter (Signed)
Patient with diagnosis of DVT/PE on Xarelto for anticoagulation.    Procedure: colonoscopy - screening Date of procedure: 2/04/30/20   CrCl 127.9 Platelet count - last lab is from 2018.  Would recommend repeat CBC for final clearance   (1st DVT/PE provoked 15+ years ago.  2nd 02/2019 (COVID exposure, w/symptoms but negative test), now resolved per most recent scan.  If no issues with CBC, would recommend to hold Xarelto only night prior to procedure and resume day of)

## 2020-02-29 NOTE — Telephone Encounter (Signed)
CBC ordered  Called pt and informed him that he needs to come in for CBC lab to get cleared for his procedure. Pt states that he cannot come in until the 1st of the year. Informed pt that he cannot be cleared for his upcoming procedure without the lab work and should come in next week so we can clear him. Verbalized understanding

## 2020-02-29 NOTE — Telephone Encounter (Signed)
   [facility] Pre-operative Risk Assessment   Request for surgical clearance:  1. What type of surgery is being performed? Colonoscopy screening   2. When is this surgery scheduled? 04-30-2020   3. What type of clearance is required (medical clearance vs. Pharmacy clearance to hold med vs. Both)? pharmacy  4. Are there any medications that need to be held prior to surgery and how long? [place]   5. Practice name and name of physician performing surgery? [facility]    6. What is the office phone number? [phone]   7.   What is the office fax number? [phone]  8.   Anesthesia type (None, local, MAC, general) ? NOT [id number]

## 2020-03-03 LAB — CBC
Hematocrit: 43.6 % (ref 37.5–51.0)
Hemoglobin: 14.9 g/dL (ref 13.0–17.7)
MCH: 32.7 pg (ref 26.6–33.0)
MCHC: 34.2 g/dL (ref 31.5–35.7)
MCV: 96 fL (ref 79–97)
Platelets: 192 10*3/uL (ref 150–450)
RBC: 4.55 x10E6/uL (ref 4.14–5.80)
RDW: 12.1 % (ref 11.6–15.4)
WBC: 5.5 10*3/uL (ref 3.4–10.8)

## 2020-03-06 NOTE — Telephone Encounter (Signed)
   Primary Cardiologist: Sanda Klein, MD  Chart reviewed as part of pre-operative protocol coverage. Patient has colonoscopy planned for 04/30/2020 and we were asked to give our recommendations for holding Xarelto.   Per Pharmacy and office protocol: Patient can hold Xarelto for 1 day prior to procedure. If not bridging, patient should restart Xarelto on the evening of procedure or day after, at discretion of procedure MD  I will route this recommendation to the requesting party via [employer] fax function and remove from pre-op pool.  Please call with questions.  Darreld Mclean, PA-C 03/06/2020, 1:54 PM

## 2020-03-06 NOTE — Telephone Encounter (Signed)
Patient with diagnosis of DVT/PE on Xarelto for anticoagulation.    Procedure: colonoscopy - screening Date of procedure: 04/30/2020  CrCl 127.9 Platelet count 192  1st DVT/PE provoked 15+ years ago.  2nd 02/2019 (COVID exposure, w/symptoms but negative test), now resolved per most recent scan  Per office protocol, patient can hold Xarelto for 1 days prior to procedure.    If not bridging, patient should restart Xarelto on the evening of procedure or day after, at discretion of procedure MD

## 2020-03-12 NOTE — Telephone Encounter (Signed)
Dr. Buccini's office states the anesthesia is Propofol.

## 2020-03-18 ENCOUNTER — Other Ambulatory Visit: Payer: Self-pay | Admitting: Internal Medicine

## 2020-04-10 ENCOUNTER — Other Ambulatory Visit: Payer: Self-pay | Admitting: Internal Medicine

## 2020-05-02 ENCOUNTER — Other Ambulatory Visit: Payer: Self-pay | Admitting: Urology

## 2020-05-02 DIAGNOSIS — M8580 Other specified disorders of bone density and structure, unspecified site: Secondary | ICD-10-CM

## 2020-09-23 ENCOUNTER — Ambulatory Visit
Admission: RE | Admit: 2020-09-23 | Discharge: 2020-09-23 | Disposition: A | Discharge status: Home / Self Care | Payer: 59 | Source: Ambulatory Visit | Attending: Urology | Admitting: Urology

## 2020-09-23 ENCOUNTER — Other Ambulatory Visit: Payer: Self-pay

## 2020-09-23 DIAGNOSIS — M8580 Other specified disorders of bone density and structure, unspecified site: Secondary | ICD-10-CM

## 2020-11-28 ENCOUNTER — Telehealth: Payer: Self-pay | Admitting: Internal Medicine

## 2020-11-28 DIAGNOSIS — Z79899 Other long term (current) drug therapy: Secondary | ICD-10-CM

## 2020-11-28 DIAGNOSIS — E785 Hyperlipidemia, unspecified: Secondary | ICD-10-CM

## 2020-11-28 DIAGNOSIS — Z7901 Long term (current) use of anticoagulants: Secondary | ICD-10-CM

## 2020-11-28 NOTE — Telephone Encounter (Signed)
Message sent to MD regarding request for labs prior to visit in March

## 2020-11-28 NOTE — Telephone Encounter (Signed)
Lipid, CMET, CBC  - thanks  Dr Lemmie Evens

## 2020-11-28 NOTE — Telephone Encounter (Signed)
  Patient would like to get lab work done before his appointment with Dr Debara Pickett on 06/05/20 @ 8:30 am. I was not sure if the orders in the system were still good. If not, can orders be placed?

## 2020-11-28 NOTE — Telephone Encounter (Signed)
Labs ordered - mailed to patient

## 2021-01-10 ENCOUNTER — Other Ambulatory Visit: Payer: Self-pay | Admitting: Cardiovascular Disease

## 2021-02-28 ENCOUNTER — Other Ambulatory Visit: Payer: Self-pay | Admitting: Internal Medicine

## 2021-03-02 NOTE — Telephone Encounter (Signed)
Prescription refill request for Xarelto received.  Indication:DVT Last office visit:upcoming Weight:103.3 kg Age:60 Scr:0.6 CrCl:193.69 ml/min  Prescription refilled

## 2021-03-14 ENCOUNTER — Other Ambulatory Visit: Payer: Self-pay | Admitting: Cardiovascular Disease

## 2021-04-15 ENCOUNTER — Encounter: Payer: Self-pay | Admitting: Internal Medicine

## 2021-05-28 LAB — COMPREHENSIVE METABOLIC PANEL
ALT: 27 IU/L (ref 0–44)
AST: 41 IU/L — ABNORMAL HIGH (ref 0–40)
Albumin/Globulin Ratio: 1.6 (ref 1.2–2.2)
Albumin: 4.4 g/dL (ref 3.8–4.9)
Alkaline Phosphatase: 68 IU/L (ref 44–121)
BUN/Creatinine Ratio: 17 (ref 9–20)
BUN: 15 mg/dL (ref 6–24)
Bilirubin Total: 0.4 mg/dL (ref 0.0–1.2)
CO2: 28 mmol/L (ref 20–29)
Calcium: 9.7 mg/dL (ref 8.7–10.2)
Chloride: 101 mmol/L (ref 96–106)
Creatinine, Ser: 0.88 mg/dL (ref 0.76–1.27)
Globulin, Total: 2.7 g/dL (ref 1.5–4.5)
Glucose: 90 mg/dL (ref 70–99)
Potassium: 4.7 mmol/L (ref 3.5–5.2)
Sodium: 140 mmol/L (ref 134–144)
Total Protein: 7.1 g/dL (ref 6.0–8.5)
eGFR: 99 mL/min/{1.73_m2} (ref >59)

## 2021-05-28 LAB — CBC
Hematocrit: 39.6 % (ref 37.5–51.0)
Hemoglobin: 13.6 g/dL (ref 13.0–17.7)
MCH: 32.9 pg (ref 26.6–33.0)
MCHC: 34.3 g/dL (ref 31.5–35.7)
MCV: 96 fL (ref 79–97)
Platelets: 211 10*3/uL (ref 150–450)
RBC: 4.14 x10E6/uL (ref 4.14–5.80)
RDW: 11.7 % (ref 11.6–15.4)
WBC: 4.9 10*3/uL (ref 3.4–10.8)

## 2021-05-28 LAB — LIPID PANEL
Chol/HDL Ratio: 3.4 ratio (ref 0.0–5.0)
Cholesterol, Total: 135 mg/dL (ref 100–199)
HDL: 40 mg/dL (ref >39)
LDL Chol Calc (NIH): 66 mg/dL (ref 0–99)
Triglycerides: 173 mg/dL — ABNORMAL HIGH (ref 0–149)
VLDL Cholesterol Cal: 29 mg/dL (ref 5–40)

## 2021-06-05 ENCOUNTER — Ambulatory Visit: Payer: 59 | Admitting: Internal Medicine

## 2021-07-06 NOTE — Progress Notes (Signed)
? ? ?Office Visit  ?  ?Patient Name: Eric Olsen ?Date of Encounter: 07/09/2021 ? ?Primary Care Provider:  Donald Prose, MD ?Primary Cardiologist:  Pixie Casino, MD ? ?Chief Complaint  ?  ?61 year old male with a history of recurrent DVT on Xarelto, hypertension, hyperlipidemia, prostate cancer with recurrence s/p XRT on antiandrogen therapy, and non-alcoholic fatty liver disease who presents for follow-up related to DVT and hypertension. ? ?Past Medical History  ?  ?Past Medical History:  ?Diagnosis Date  ? Asthma   ? states well controlled  ? Dyslipidemia   ? Fatty liver   ? GERD (gastroesophageal reflux disease)   ? History of DVT (deep vein thrombosis)   ? following achilles tendon surgery  ? History of gallstones   ? History of hiatal hernia   ? History of pulmonary embolus (PE)   ? following achilles tendon surgery , pt unaware  ? Hypertension   ? cardio Dr Debara Pickett-  LOV note 2/12 on chart  ? Lesion of bladder   ? Peripheral vascular disease ([place])   ? hx DVT 10 years ago following achilles tendon tear & PE-  last anticoagulation years ago  ? PONV (postoperative nausea and vomiting)   ? after radial nerve surgery only  ? Prostate cancer ([place])   ? Radial tunnel syndrome, right   ? Recurrent upper respiratory infection (URI)   ? recent head cold- was on augumentin- states no fever- states resolved  ? ?Past Surgical History:  ?Procedure Laterality Date  ? CARDIAC CATHETERIZATION  03/16/2005  ? RCA is large & dominant, no lesions, EF 60% (Dr. Domenic Moras)  ? CHOLECYSTECTOMY N/A 01/27/2016  ? Procedure: LAPAROSCOPIC CHOLECYSTECTOMY WITH INTRAOPERATIVE CHOLANGIOGRAM;  Surgeon: Jackolyn Confer, MD;  Location: [facility];  Service: General;  Laterality: N/A;  ? COLONOSCOPY    ? CYSTOSCOPY WITH BIOPSY N/A 09/12/2018  ? Procedure: CYSTOSCOPY WITH BLADDER / FULGURATION BIOPSY;  Surgeon: Ceasar Mons, MD;  Location: [facility];  Service: Urology;  Laterality: N/A;  ONLY NEEDS 30 MIN  ? NM MYOCAR  PERF WALL MOTION  2006  ? persantine study - inferior wall thinning; EF 63%; low risk  ? PROSTATE BIOPSY    ? Radial NERVE DECOMPRESSION    ? right elbow- radial   ? ROBOT ASSISTED LAPAROSCOPIC RADICAL PROSTATECTOMY  04/28/2011  ? Procedure: ROBOTIC ASSISTED LAPAROSCOPIC RADICAL PROSTATECTOMY;  Surgeon: Bernestine Amass, MD;  Location: [facility];  Service: Urology;  Laterality: N/A;  ? TONSILLECTOMY    ? TRANSTHORACIC ECHOCARDIOGRAM  2009  ? EF=>55%, borderline conc LVH, trace MR/TR  ? UPPER GI ENDOSCOPY    ? ? ?Allergies ? ?No Known Allergies ? ?History of Present Illness  ?  ?61 year old male with the above past medical history including recurrent DVT on Xarelto, hypertension, hyperlipidemia, prostate cancer with recurrence s/p XRT on antiandrogen therapy, and non-alcoholic fatty liver disease. ? ?He had a prior DVT/PE following achilles tendon rupture.  He has a history of recurrent DVT/PE, thought to be related to possible risk factors for hypercoagulable state including COVID-19 infection, and prostate cancer.  Lower extremity duplex in November 2021 showed resolution of DVT.  However, given recurrence of DVT, he was advised that he would likely require lifelong anticoagulation.  He takes Crestor 10 mg every other day for hyperlipidemia.  He was last seen in the office on 02/08/2020 and was stable from a cardiac standpoint.  Eliquis was switched to Xarelto.  ? ?He presents today for follow-up.  Since his last visit he has been stable overall from a cardiac standpoint.  Over the past several months he has noted occasional lightheadedness, this occurs randomly, lasts for seconds to minutes and resolve spontaneously.  He denies any associated symptoms.  He denies dyspnea, palpitations, visual changes, presyncope, syncope. He has chronic mild dependent lower extremity edema.  His biggest concern today is that he has had a nonproductive cough that has lasted for 2 months following a cold.  He has not discussed this with  his PCP.  Other than his cough and occasional lightheadedness, he reports feeling well overall and denies any new concerns today. ? ?Home Medications  ?  ?Current Outpatient Medications  ?Medication Sig Dispense Refill  ? albuterol (PROVENTIL HFA;VENTOLIN HFA) 108 (90 BASE) MCG/ACT inhaler Inhale 2 puffs into the lungs every 6 (six) hours as needed for wheezing or shortness of breath.     ? aspirin EC 81 MG tablet Take 81 mg by mouth daily. Swallow whole.    ? bicalutamide (CASODEX) 50 MG tablet Take 50 mg by mouth daily.    ? lisinopril (ZESTRIL) 20 MG tablet TAKE 1 TABLET BY MOUTH EVERY DAY 90 tablet 2  ? loratadine (CLARITIN) 10 MG tablet Take 10 mg by mouth daily.    ? montelukast (SINGULAIR) 10 MG tablet Take 10 mg by mouth every morning.   3  ? Multiple Vitamin (MULTIVITAMIN) capsule Take 1 capsule by mouth daily. Has stopped for procedure    ? rosuvastatin (CRESTOR) 10 MG tablet TAKE 1/2 TABLET BY MOUTH EVERY DAY 45 tablet 1  ? venlafaxine (EFFEXOR) 75 MG tablet Take 75 mg by mouth 2 (two) times daily.    ? vitamin C (ASCORBIC ACID) 500 MG tablet Take 500 mg by mouth daily. Has stopped for procedure    ? XARELTO 10 MG TABS tablet TAKE 1 TABLET BY MOUTH EVERY DAY 90 tablet 3  ? ?No current facility-administered medications for this visit.  ?  ? ?Review of Systems  ?  ?He denies chest pain, palpitations, dyspnea, pnd, orthopnea, n, v, dizziness, syncope, edema, weight gain, or early satiety. All other systems reviewed and are otherwise negative except as noted above.  ? ?Physical Exam  ?  ?VS:  BP 130/78   Pulse 83   Ht 5' 9.5" (1.765 m)   Wt 248 lb 6.4 oz (112.7 kg)   SpO2 96%   BMI 36.16 kg/m?  ?GEN: Well nourished, well developed, in no acute distress. ?HEENT: normal. ?Neck: Supple, no JVD, carotid bruits, or masses. ?Cardiac: RRR, no murmurs, rubs, or gallops. No clubbing, cyanosis, edema.  Radials/DP/PT 2+ and equal bilaterally.  ?Respiratory:  Respirations regular and unlabored, clear to auscultation  bilaterally. ?GI: Soft, nontender, nondistended, BS + x 4. ?MS: no deformity or atrophy. ?Skin: warm and dry, no rash. ?Neuro:  Strength and sensation are intact. ?Psych: Normal affect. ? ?Accessory Clinical Findings  ?  ?ECG personally reviewed by me today -NSR, 83 bpm- no acute changes. ? ?Lab Results  ?Component Value Date  ? WBC 4.9 05/28/2021  ? HGB 13.6 05/28/2021  ? HCT 39.6 05/28/2021  ? MCV 96 05/28/2021  ? PLT 211 05/28/2021  ? ?Lab Results  ?Component Value Date  ? CREATININE 0.88 05/28/2021  ? BUN 15 05/28/2021  ? NA 140 05/28/2021  ? K 4.7 05/28/2021  ? CL 101 05/28/2021  ? CO2 28 05/28/2021  ? ?Lab Results  ?Component Value Date  ? ALT 27 05/28/2021  ? AST 41 (  H) 05/28/2021  ? ALKPHOS 68 05/28/2021  ? BILITOT 0.4 05/28/2021  ? ?Lab Results  ?Component Value Date  ? CHOL 135 05/28/2021  ? HDL 40 05/28/2021  ? [place] 66 05/28/2021  ? TRIG 173 (H) 05/28/2021  ? CHOLHDL 3.4 05/28/2021  ?  ?No results found for: HGBA1C ? ?Assessment & Plan  ? ?1. Lightheadedness: He reports occasional lightheadedness with walking over the past several months. He denies any associated symptoms. Denies palpitations, dyspnea, presyncope, syncope. EKG today shows normal sinus rhythm, 83 bpm.  BP stable.  Hemoglobin was stable in March 2023. Carotid dopplers were normal in 2020. Echo normal in 2009.  Possibly related to orthostatic hypotension.  If symptoms persist or worsen, could consider repeat carotid dopplers, cardiac monitor, echo.  Encouraged adequate hydration, gradual positional changes, compression. ? ?2. Cough: He notes a persistent nonproductive cough following a cold that has lasted for about 2 months.  He does have a history of asthma and allergies.  Recommend follow-up with PCP.  If symptoms persist, could consider switching lisinopril to ARB. ? ?3. H/o recurrent DVT: He has chronic mild bilateral lower extremity dependent edema. Denies symptoms concerning for DVT.  Denies bleeding on Xarelto.  Encouraged  elevation, compression. Continue Xarelto. ? ?4. Hypertension: BP well controlled. Continue current antihypertensive regimen.  ? ?5. Hyperlipidemia: LDL was 66 in March 2023, triglycerides were elevated at 173.  E

## 2021-07-09 ENCOUNTER — Encounter: Payer: Self-pay | Admitting: Nurse Practitioner

## 2021-07-09 ENCOUNTER — Ambulatory Visit: Payer: 59 | Admitting: Nurse Practitioner

## 2021-07-09 VITALS — BP 130/78 | HR 83 | Ht 69.5 in | Wt 248.4 lb

## 2021-07-09 DIAGNOSIS — R059 Cough, unspecified: Secondary | ICD-10-CM | POA: Diagnosis not present

## 2021-07-09 DIAGNOSIS — I10 Essential (primary) hypertension: Secondary | ICD-10-CM | POA: Diagnosis not present

## 2021-07-09 DIAGNOSIS — I82592 Chronic embolism and thrombosis of other specified deep vein of left lower extremity: Secondary | ICD-10-CM

## 2021-07-09 DIAGNOSIS — R42 Dizziness and giddiness: Secondary | ICD-10-CM

## 2021-07-09 DIAGNOSIS — E785 Hyperlipidemia, unspecified: Secondary | ICD-10-CM

## 2021-07-09 NOTE — Patient Instructions (Signed)
Medication Instructions:  ?The current medical regimen is effective;  continue present plan and medications. ? ?*If you need a refill on your cardiac medications before your next appointment, please call your pharmacy* ? ? ?Follow-Up: ?At [facility], you and your health needs are our priority.  As part of our continuing mission to provide you with exceptional heart care, we have created designated Provider Care Teams.  These Care Teams include your primary Cardiologist (physician) and Advanced Practice Providers (APPs -  Physician Assistants and Nurse Practitioners) who all work together to provide you with the care you need, when you need it. ? ?We recommend signing up for the patient portal called "MyChart".  Sign up information is provided on this After Visit Summary.  MyChart is used to connect with patients for Virtual Visits (Telemedicine).  Patients are able to view lab/test results, encounter notes, upcoming appointments, etc.  Non-urgent messages can be sent to your provider as well.   ?To learn more about what you can do with MyChart, go to [email or web address].   ? ?Your next appointment:   ?12 month(s) ? ?The format for your next appointment:   ?In Person ? ?Provider:   ?Dr.Hilty   ? ? ?Other Instructions ?Follow up with PCP regarding cough. ? ?

## 2021-09-22 ENCOUNTER — Encounter: Payer: Self-pay | Admitting: Internal Medicine

## 2021-09-22 MED ORDER — ROSUVASTATIN CALCIUM 5 MG PO TABS: 5.0000 mg | ORAL_TABLET | Freq: Every day | ORAL | 3 refills | Status: DC

## 2021-09-22 NOTE — Telephone Encounter (Signed)
Spoke  patient . Patient states he is taking (1/2 tablet of 10 mg ) 5 mg Rosuvastatin.  RN informed patient will send a 5 mg tablet into the pharmacy so he does not have to split the pill.  Patient verbalized understanding.

## 2021-10-12 ENCOUNTER — Other Ambulatory Visit: Payer: Self-pay | Admitting: Internal Medicine

## 2021-11-17 ENCOUNTER — Other Ambulatory Visit: Payer: Self-pay | Admitting: Urology

## 2021-11-17 ENCOUNTER — Other Ambulatory Visit (HOSPITAL_COMMUNITY): Payer: Self-pay | Admitting: Urology

## 2021-11-17 DIAGNOSIS — C61 Malignant neoplasm of prostate: Secondary | ICD-10-CM

## 2021-11-29 ENCOUNTER — Other Ambulatory Visit: Payer: Self-pay | Admitting: Internal Medicine

## 2021-12-01 NOTE — Telephone Encounter (Signed)
Prescription refill request for Xarelto received.  Indication:DVT Last office visit:4/23 Weight:112.7 kg Age:61 Scr:0.8 CrCl:158.48 ml/min  Prescription refilled

## 2021-12-28 ENCOUNTER — Ambulatory Visit (HOSPITAL_COMMUNITY)
Admission: RE | Admit: 2021-12-28 | Discharge: 2021-12-28 | Disposition: A | Discharge status: Home / Self Care | Payer: 59 | Source: Ambulatory Visit | Attending: Urology | Admitting: Urology

## 2021-12-28 ENCOUNTER — Encounter (HOSPITAL_COMMUNITY)
Admission: RE | Admit: 2021-12-28 | Discharge: 2021-12-28 | Disposition: A | Discharge status: Home / Self Care | Payer: 59 | Source: Ambulatory Visit | Attending: Urology | Admitting: Urology

## 2021-12-28 DIAGNOSIS — C61 Malignant neoplasm of prostate: Secondary | ICD-10-CM | POA: Diagnosis present

## 2021-12-28 MED ORDER — TECHNETIUM TC 99M MEDRONATE IV KIT
20.0000 | PACK | Freq: Once | INTRAVENOUS | Status: AC | PRN
  Administered 2021-12-28: 20.2 via INTRAVENOUS

## 2022-03-08 ENCOUNTER — Encounter (HOSPITAL_BASED_OUTPATIENT_CLINIC_OR_DEPARTMENT_OTHER): Payer: Self-pay | Admitting: Internal Medicine

## 2022-03-09 ENCOUNTER — Other Ambulatory Visit: Payer: Self-pay

## 2022-03-09 MED ORDER — RIVAROXABAN 10 MG PO TABS: 10.0000 mg | ORAL_TABLET | Freq: Every day | ORAL | 3 refills | Status: DC

## 2022-03-09 NOTE — Telephone Encounter (Signed)
Prescription refill request for Xarelto received.  Indication:DVT Last office visit:4/23 Weight:112.7 kg Age:61 Scr:0.8 CrCl:156.53  ml/min  Prescription refilled

## 2022-03-09 NOTE — Telephone Encounter (Signed)
Xarelto refill sent this morning.

## 2022-05-24 ENCOUNTER — Encounter (HOSPITAL_COMMUNITY): Payer: Self-pay | Admitting: Emergency Medicine

## 2022-05-24 ENCOUNTER — Emergency Department (HOSPITAL_COMMUNITY): Payer: 59

## 2022-05-24 ENCOUNTER — Emergency Department (HOSPITAL_COMMUNITY)
Admission: EM | Admit: 2022-05-24 | Discharge: 2022-05-24 | Disposition: A | Discharge status: Home / Self Care | Payer: 59 | Attending: Emergency Medicine | Admitting: Emergency Medicine

## 2022-05-24 ENCOUNTER — Other Ambulatory Visit: Payer: Self-pay

## 2022-05-24 DIAGNOSIS — Z7982 Long term (current) use of aspirin: Secondary | ICD-10-CM | POA: Insufficient documentation

## 2022-05-24 DIAGNOSIS — K625 Hemorrhage of anus and rectum: Secondary | ICD-10-CM | POA: Insufficient documentation

## 2022-05-24 DIAGNOSIS — K529 Noninfective gastroenteritis and colitis, unspecified: Secondary | ICD-10-CM | POA: Insufficient documentation

## 2022-05-24 DIAGNOSIS — Z7901 Long term (current) use of anticoagulants: Secondary | ICD-10-CM | POA: Diagnosis not present

## 2022-05-24 DIAGNOSIS — R109 Unspecified abdominal pain: Secondary | ICD-10-CM | POA: Diagnosis present

## 2022-05-24 LAB — CBC WITH DIFFERENTIAL/PLATELET
Abs Immature Granulocytes: 0.02 10*3/uL (ref 0.00–0.07)
Basophils Absolute: 0.1 10*3/uL (ref 0.0–0.1)
Basophils Relative: 1 %
Eosinophils Absolute: 0.2 10*3/uL (ref 0.0–0.5)
Eosinophils Relative: 2 %
HCT: 40.2 % (ref 39.0–52.0)
Hemoglobin: 13.4 g/dL (ref 13.0–17.0)
Immature Granulocytes: 0 %
Lymphocytes Relative: 22 %
Lymphs Abs: 2 10*3/uL (ref 0.7–4.0)
MCH: 32.8 pg (ref 26.0–34.0)
MCHC: 33.3 g/dL (ref 30.0–36.0)
MCV: 98.5 fL (ref 80.0–100.0)
Monocytes Absolute: 1 10*3/uL (ref 0.1–1.0)
Monocytes Relative: 11 %
Neutro Abs: 5.8 10*3/uL (ref 1.7–7.7)
Neutrophils Relative %: 64 %
Platelets: 174 10*3/uL (ref 150–400)
RBC: 4.08 MIL/uL — ABNORMAL LOW (ref 4.22–5.81)
RDW: 12.2 % (ref 11.5–15.5)
WBC: 9 10*3/uL (ref 4.0–10.5)
nRBC: 0 % (ref 0.0–0.2)

## 2022-05-24 LAB — COMPREHENSIVE METABOLIC PANEL
ALT: 29 U/L (ref 0–44)
AST: 46 U/L — ABNORMAL HIGH (ref 15–41)
Albumin: 3.9 g/dL (ref 3.5–5.0)
Alkaline Phosphatase: 55 U/L (ref 38–126)
Anion gap: 5 (ref 5–15)
BUN: 13 mg/dL (ref 6–20)
CO2: 26 mmol/L (ref 22–32)
Calcium: 8.8 mg/dL — ABNORMAL LOW (ref 8.9–10.3)
Chloride: 105 mmol/L (ref 98–111)
Creatinine, Ser: 0.82 mg/dL (ref 0.61–1.24)
GFR, Estimated: >60 mL/min (ref >60)
Glucose, Bld: 100 mg/dL — ABNORMAL HIGH (ref 70–99)
Potassium: 3.8 mmol/L (ref 3.5–5.1)
Sodium: 136 mmol/L (ref 135–145)
Total Bilirubin: 0.7 mg/dL (ref 0.3–1.2)
Total Protein: 7.5 g/dL (ref 6.5–8.1)

## 2022-05-24 LAB — TYPE AND SCREEN
ABO/RH(D): A POS
Antibody Screen: NEGATIVE

## 2022-05-24 LAB — LIPASE, BLOOD: Lipase: 32 U/L (ref 11–51)

## 2022-05-24 MED ORDER — SODIUM CHLORIDE 0.9 % IV SOLN: INTRAVENOUS | Status: DC

## 2022-05-24 MED ORDER — IOHEXOL 300 MG/ML  SOLN
100.0000 mL | Freq: Once | INTRAMUSCULAR | Status: AC | PRN
  Administered 2022-05-24: 100 mL via INTRAVENOUS

## 2022-05-24 MED ORDER — CIPROFLOXACIN HCL 500 MG PO TABS: 500.0000 mg | ORAL_TABLET | Freq: Two times a day (BID) | ORAL | 0 refills | Status: AC

## 2022-05-24 MED ORDER — SODIUM CHLORIDE 0.9 % IV BOLUS
500.0000 mL | Freq: Once | INTRAVENOUS | Status: AC
  Administered 2022-05-24: 500 mL via INTRAVENOUS

## 2022-05-24 MED ORDER — METRONIDAZOLE 500 MG PO TABS: 500.0000 mg | ORAL_TABLET | Freq: Two times a day (BID) | ORAL | 0 refills | Status: AC

## 2022-05-24 NOTE — ED Provider Notes (Signed)
[facility] Provider Note   CSN: [id number] Arrival date & time: 05/24/22  [id number]     History  Chief Complaint  Patient presents with   Rectal Bleeding   Abdominal Pain    Eric Olsen is a 62 y.o. male.   Rectal Bleeding Associated symptoms: abdominal pain   Abdominal Pain Associated symptoms: hematochezia      Patient presents to the ED for evaluation of abdominal pain and rectal bleeding.  Patient states he started having symptoms on Saturday.  He began having intermittent severe abdominal cramping.  It gets worse when he is eating.  He noticed that he was passing blood mixed with his stool ever since then.  Patient's symptoms have continued and this morning he was having more abdominal discomfort so he came to the ED for evaluation.  Patient denies any fevers.  He is on Xarelto.  He denies any prior history of diverticulitis.  Home Medications Prior to Admission medications   Medication Sig Start Date End Date Taking? Authorizing Provider  ciprofloxacin (CIPRO) 500 MG tablet Take 1 tablet (500 mg total) by mouth 2 (two) times daily for 7 days. 05/24/22 05/31/22 Yes Dorie Rank, MD  metroNIDAZOLE (FLAGYL) 500 MG tablet Take 1 tablet (500 mg total) by mouth 2 (two) times daily. 05/24/22  Yes Dorie Rank, MD  albuterol (PROVENTIL HFA;VENTOLIN HFA) 108 (90 BASE) MCG/ACT inhaler Inhale 2 puffs into the lungs every 6 (six) hours as needed for wheezing or shortness of breath.     [provider]  aspirin EC 81 MG tablet Take 81 mg by mouth daily. Swallow whole.    [provider]  bicalutamide (CASODEX) 50 MG tablet Take 50 mg by mouth daily. 01/17/20   [provider]  lisinopril (ZESTRIL) 20 MG tablet TAKE 1 TABLET BY MOUTH EVERY DAY 10/14/21   Hilty, Nadean Corwin, MD  loratadine (CLARITIN) 10 MG tablet Take 10 mg by mouth daily.    [provider]  montelukast (SINGULAIR) 10 MG tablet Take 10 mg by mouth  every morning.  12/23/17   [provider]  Multiple Vitamin (MULTIVITAMIN) capsule Take 1 capsule by mouth daily. Has stopped for procedure    [provider]  rivaroxaban (XARELTO) 10 MG TABS tablet Take 1 tablet (10 mg total) by mouth daily. 03/09/22   Hilty, Nadean Corwin, MD  rosuvastatin (CRESTOR) 5 MG tablet Take 1 tablet (5 mg total) by mouth daily. 09/22/21   Pixie Casino, MD  venlafaxine (EFFEXOR) 75 MG tablet Take 75 mg by mouth 2 (two) times daily. 06/01/21   [provider]  vitamin C (ASCORBIC ACID) 500 MG tablet Take 500 mg by mouth daily. Has stopped for procedure    [provider]      Allergies    Patient has no known allergies.    Review of Systems   Review of Systems  Gastrointestinal:  Positive for abdominal pain and hematochezia.    Physical Exam Updated Vital Signs BP (!) 132/95   Pulse 89   Temp 98 F (36.7 C) (Oral)   Resp 18   Ht 1.778 m ('5\' 10"'$ )   Wt 110.7 kg   SpO2 100%   BMI 35.01 kg/m  Physical Exam Vitals and nursing note reviewed.  Constitutional:      General: He is not in acute distress.    Appearance: He is well-developed.  HENT:     Head: Normocephalic and atraumatic.  Right Ear: External ear normal.     Left Ear: External ear normal.  Eyes:     General: No scleral icterus.       Right eye: No discharge.        Left eye: No discharge.     Conjunctiva/sclera: Conjunctivae normal.  Neck:     Trachea: No tracheal deviation.  Cardiovascular:     Rate and Rhythm: Normal rate and regular rhythm.  Pulmonary:     Effort: Pulmonary effort is normal. No respiratory distress.     Breath sounds: Normal breath sounds. No stridor. No wheezing or rales.  Abdominal:     General: Bowel sounds are normal. There is no distension.     Palpations: Abdomen is soft.     Tenderness: There is abdominal tenderness in the left lower quadrant. There is no guarding or rebound.  Musculoskeletal:        General: No  tenderness or deformity.     Cervical back: Neck supple.  Skin:    General: Skin is warm and dry.     Findings: No rash.  Neurological:     General: No focal deficit present.     Mental Status: He is alert.     Cranial Nerves: No cranial nerve deficit, dysarthria or facial asymmetry.     Sensory: No sensory deficit.     Motor: No abnormal muscle tone or seizure activity.     Coordination: Coordination normal.  Psychiatric:        Mood and Affect: Mood normal.     ED Results / Procedures / Treatments   Labs (all labs ordered are listed, but only abnormal results are displayed) Labs Reviewed  COMPREHENSIVE METABOLIC PANEL - Abnormal; Notable for the following components:      Result Value   Glucose, Bld 100 (*)    Calcium 8.8 (*)    AST 46 (*)    All other components within normal limits  CBC WITH DIFFERENTIAL/PLATELET - Abnormal; Notable for the following components:   RBC 4.08 (*)    All other components within normal limits  GASTROINTESTINAL PANEL BY PCR, STOOL (REPLACES STOOL CULTURE)  LIPASE, BLOOD  POC OCCULT BLOOD, ED  TYPE AND SCREEN    EKG None  Radiology CT ABDOMEN PELVIS W CONTRAST  Result Date: 05/24/2022 CLINICAL DATA:  Left lower quadrant abdominal pain. Rectal bleeding. Symptoms began 48 hours ago. EXAM: CT ABDOMEN AND PELVIS WITH CONTRAST TECHNIQUE: Multidetector CT imaging of the abdomen and pelvis was performed using the standard protocol following bolus administration of intravenous contrast. RADIATION DOSE REDUCTION: This exam was performed according to the departmental dose-optimization program which includes automated exposure control, adjustment of the mA and/or kV according to patient size and/or use of iterative reconstruction technique. CONTRAST:  162m OMNIPAQUE IOHEXOL 300 MG/ML  SOLN COMPARISON:  12/28/2021 FINDINGS: Lower chest: Lung bases are clear.  Small hiatal hernia is present. Hepatobiliary: Diffuse fatty change of the liver. Previous  cholecystectomy. Pancreas: Normal Spleen: Normal Adrenals/Urinary Tract: Adrenal glands are normal. Kidneys are normal. Bladder is normal. Stomach/Bowel: Small hiatal hernia as noted above. Small bowel is normal except for wall thickening of the distal/terminal ileum with fatty change, usually indicative of chronic/inactive disease. There is mild dilatation of the appendix as well, but no surrounding inflammatory change. The right colon, transverse colon and descending colon are normal. At the descending sigmoid junction and throughout the sigmoid colon, there is an active colitis pattern with wall thickening and mesenteric edema. The distal  sigmoid colon and rectum regain a normal appearance. Findings could represent Crohn's disease, but nonspecific enteritis including infectious enteritis is also possible. Certainly, the sigmoid colitis is likely the cause of the acute pain and bleeding. Vascular/Lymphatic: No evidence of atherosclerotic vascular disease. Major arterial branch vessels show flow. IVC is normal. No adenopathy. Reproductive: Previous prostatectomy. No evidence of residual or recurrent mass. Other: No free fluid or air. Musculoskeletal: Minimal lower lumbar degenerative changes. IMPRESSION: 1. Acute colitis pattern at the descending sigmoid junction and throughout the sigmoid colon with wall thickening and mesenteric edema. The distal sigmoid colon and rectum regain a normal appearance. Findings could represent Crohn's disease, but nonspecific enteritis including infectious enteritis is also possible. Certainly, the sigmoid colitis is likely the cause of the acute pain and bleeding. 2. Wall thickening of the distal/terminal ileum with fatty change, usually indicative of chronic/inactive disease. 3. Diffuse fatty change of the liver. 4. Previous cholecystectomy. 5. Small hiatal hernia. 6. Previous prostatectomy. No evidence of residual or recurrent mass. Electronically Signed   By: Nelson Chimes M.D.    On: 05/24/2022 12:39    Procedures Procedures    Medications Ordered in ED Medications  sodium chloride 0.9 % bolus 500 mL (0 mLs Intravenous Stopped 05/24/22 1133)    And  0.9 %  sodium chloride infusion (has no administration in time range)  iohexol (OMNIPAQUE) 300 MG/ML solution 100 mL (100 mLs Intravenous Contrast Given 05/24/22 1211)    ED Course/ Medical Decision Making/ A&P Clinical Course as of 05/24/22 1352  Mon May 24, 2022  1303 CT scan of the abdomen pelvis shows evidence of acute colitis.  Crohn's is a possibility as well as nonspecific enteritis including infectious etiology [JK]  1311 CBC without anemia or significant leukocytosis.  Metabolic panel normal [JK]  1329 Discussed with Dr Therisa Doyne.  Ok with cipro flagyl for 7 days.  Close outpt follow up with GI.  Stool panel as well if pt can provide while in the ED [JK]    Clinical Course User Index [JK] Dorie Rank, MD                             Medical Decision Making Problems Addressed: Colitis: acute illness or injury that poses a threat to life or bodily functions  Amount and/or Complexity of Data Reviewed Labs: ordered. Decision-making details documented in ED Course. Radiology: ordered and independent interpretation performed.  Risk Prescription drug management.   Patient presented to the ED for evaluation of rectal bleeding abdominal pain.  Patient without signs of anemia in the ED.  He has not had any recurrent rectal bleeding.  No signs of any significant blood loss.  Patient's metabolic panel does not show any evidence of kidney injury or acute electrolyte abnormalities.  Less concerned about the possibility of diverticulitis colitis mass concerning his rectal bleeding.  CT scan shows evidence of colitis.  Discussed the case with Dr. Deno Etienne.  Will start the patient Cipro and Flagyl for possible infectious etiology.  Patient will follow-up in the GI clinic.  While the patient was in the emergency department he  actually scheduled a follow-up appointment later this week.  I will have the patient hold his Xarelto for the next few days.  Patient has been taking Xarelto for DVTs but he has not had 1 in several years.  Patient instructed return for recurrent bleeding fever pain        Final Clinical Impression(s) /  ED Diagnoses Final diagnoses:  Colitis    Rx / DC Orders ED Discharge Orders          Ordered    ciprofloxacin (CIPRO) 500 MG tablet  2 times daily        05/24/22 1348    metroNIDAZOLE (FLAGYL) 500 MG tablet  2 times daily        05/24/22 1348              Dorie Rank, MD 05/24/22 1352

## 2022-05-24 NOTE — ED Triage Notes (Signed)
Patient arrives ambulatory by POV c/o rectal bleeding and abdominal cramping onset of Saturday. Patient on Xarelto.

## 2022-05-24 NOTE — Discharge Instructions (Signed)
Hold your Xarelto for the next 3 days because of your rectal bleeding.  Start taking the antibiotics as prescribed.  Follow-up with your GI doctor as planned.  Return to the ED for high fevers, increasing bleeding or pain.

## 2022-07-02 ENCOUNTER — Encounter (HOSPITAL_BASED_OUTPATIENT_CLINIC_OR_DEPARTMENT_OTHER): Payer: Self-pay | Admitting: Internal Medicine

## 2022-07-17 ENCOUNTER — Other Ambulatory Visit: Payer: Self-pay | Admitting: Internal Medicine

## 2022-08-02 DIAGNOSIS — E785 Hyperlipidemia, unspecified: Secondary | ICD-10-CM

## 2022-08-02 DIAGNOSIS — I10 Essential (primary) hypertension: Secondary | ICD-10-CM

## 2022-08-03 ENCOUNTER — Other Ambulatory Visit: Payer: Self-pay

## 2022-08-03 DIAGNOSIS — E785 Hyperlipidemia, unspecified: Secondary | ICD-10-CM

## 2022-08-03 DIAGNOSIS — I10 Essential (primary) hypertension: Secondary | ICD-10-CM

## 2022-08-04 LAB — COMPREHENSIVE METABOLIC PANEL
ALT: 28 IU/L (ref 0–44)
AST: 42 IU/L — ABNORMAL HIGH (ref 0–40)
Albumin/Globulin Ratio: 1.6 (ref 1.2–2.2)
Albumin: 4.2 g/dL (ref 3.8–4.9)
Alkaline Phosphatase: 78 IU/L (ref 44–121)
BUN/Creatinine Ratio: 18 (ref 10–24)
BUN: 15 mg/dL (ref 8–27)
Bilirubin Total: 0.3 mg/dL (ref 0.0–1.2)
CO2: 26 mmol/L (ref 20–29)
Calcium: 9.6 mg/dL (ref 8.6–10.2)
Chloride: 102 mmol/L (ref 96–106)
Creatinine, Ser: 0.83 mg/dL (ref 0.76–1.27)
Globulin, Total: 2.7 g/dL (ref 1.5–4.5)
Glucose: 89 mg/dL (ref 70–99)
Potassium: 4.7 mmol/L (ref 3.5–5.2)
Sodium: 141 mmol/L (ref 134–144)
Total Protein: 6.9 g/dL (ref 6.0–8.5)
eGFR: 100 mL/min/{1.73_m2} (ref >59)

## 2022-08-04 LAB — LIPID PANEL
Chol/HDL Ratio: 3 ratio (ref 0.0–5.0)
Cholesterol, Total: 128 mg/dL (ref 100–199)
HDL: 43 mg/dL (ref >39)
LDL Chol Calc (NIH): 59 mg/dL (ref 0–99)
Triglycerides: 151 mg/dL — ABNORMAL HIGH (ref 0–149)
VLDL Cholesterol Cal: 26 mg/dL (ref 5–40)

## 2022-08-04 LAB — CBC
Hematocrit: 40.4 % (ref 37.5–51.0)
Hemoglobin: 13.8 g/dL (ref 13.0–17.7)
MCH: 32.9 pg (ref 26.6–33.0)
MCHC: 34.2 g/dL (ref 31.5–35.7)
MCV: 96 fL (ref 79–97)
Platelets: 174 10*3/uL (ref 150–450)
RBC: 4.19 x10E6/uL (ref 4.14–5.80)
RDW: 12.4 % (ref 11.6–15.4)
WBC: 5.9 10*3/uL (ref 3.4–10.8)

## 2022-08-05 ENCOUNTER — Ambulatory Visit: Payer: 59 | Admitting: Student

## 2022-08-06 ENCOUNTER — Ambulatory Visit: Payer: 59 | Admitting: Nurse Practitioner

## 2022-08-24 ENCOUNTER — Ambulatory Visit: Payer: 59 | Attending: Student | Admitting: Nurse Practitioner

## 2022-08-24 ENCOUNTER — Encounter: Payer: Self-pay | Admitting: Nurse Practitioner

## 2022-08-24 VITALS — BP 126/86 | HR 84 | Ht 69.0 in | Wt 249.6 lb

## 2022-08-24 DIAGNOSIS — I10 Essential (primary) hypertension: Secondary | ICD-10-CM

## 2022-08-24 DIAGNOSIS — E782 Mixed hyperlipidemia: Secondary | ICD-10-CM

## 2022-08-24 DIAGNOSIS — R42 Dizziness and giddiness: Secondary | ICD-10-CM

## 2022-08-24 DIAGNOSIS — Z7901 Long term (current) use of anticoagulants: Secondary | ICD-10-CM

## 2022-08-24 DIAGNOSIS — I82592 Chronic embolism and thrombosis of other specified deep vein of left lower extremity: Secondary | ICD-10-CM | POA: Diagnosis not present

## 2022-08-24 MED ORDER — LISINOPRIL 20 MG PO TABS: 20.0000 mg | ORAL_TABLET | Freq: Every day | ORAL | 3 refills | Status: DC

## 2022-08-24 MED ORDER — ROSUVASTATIN CALCIUM 5 MG PO TABS: 5.0000 mg | ORAL_TABLET | Freq: Every day | ORAL | 3 refills | Status: DC

## 2022-08-24 NOTE — Progress Notes (Signed)
Office Visit    Patient Name: Eric Olsen Date of Encounter: 08/24/2022  Primary Care Provider:  Deatra James, MD Primary Cardiologist:  Chrystie Nose, MD  Chief Complaint    62 year old male with a history of recurrent DVT on Xarelto, hypertension, hyperlipidemia, prostate cancer with recurrence s/p XRT on antiandrogen therapy, and non-alcoholic fatty liver disease who presents for follow-up related to DVT and hypertension.   Past Medical History    Past Medical History:  Diagnosis Date   Asthma    states well controlled   Dyslipidemia    Fatty liver    GERD (gastroesophageal reflux disease)    History of DVT (deep vein thrombosis)    following achilles tendon surgery   History of gallstones    History of hiatal hernia    History of pulmonary embolus (PE)    following achilles tendon surgery , pt unaware   Hypertension    cardio Dr Rennis Golden-  LOV note 2/12 on chart   Lesion of bladder    Peripheral vascular disease (HCC)    hx DVT 10 years ago following achilles tendon tear & PE-  last anticoagulation years ago   PONV (postoperative nausea and vomiting)    after radial nerve surgery only   Prostate cancer (HCC)    Radial tunnel syndrome, right    Recurrent upper respiratory infection (URI)    recent head cold- was on augumentin- states no fever- states resolved   Past Surgical History:  Procedure Laterality Date   CARDIAC CATHETERIZATION  03/16/2005   RCA is large & dominant, no lesions, EF 60% (Dr. Aram Candela)   CHOLECYSTECTOMY N/A 01/27/2016   Procedure: LAPAROSCOPIC CHOLECYSTECTOMY WITH INTRAOPERATIVE CHOLANGIOGRAM;  Surgeon: Avel Peace, MD;  Location: [facility];  Service: General;  Laterality: N/A;   COLONOSCOPY     CYSTOSCOPY WITH BIOPSY N/A 09/12/2018   Procedure: CYSTOSCOPY WITH BLADDER / FULGURATION BIOPSY;  Surgeon: Rene Paci, MD;  Location: [facility];  Service: Urology;  Laterality: N/A;  ONLY NEEDS 30 MIN   NM MYOCAR  PERF WALL MOTION  2006   persantine study - inferior wall thinning; EF 63%; low risk   PROSTATE BIOPSY     Radial NERVE DECOMPRESSION     right elbow- radial    ROBOT ASSISTED LAPAROSCOPIC RADICAL PROSTATECTOMY  04/28/2011   Procedure: ROBOTIC ASSISTED LAPAROSCOPIC RADICAL PROSTATECTOMY;  Surgeon: Valetta Fuller, MD;  Location: [facility];  Service: Urology;  Laterality: N/A;   TONSILLECTOMY     TRANSTHORACIC ECHOCARDIOGRAM  2009   EF=>55%, borderline conc LVH, trace MR/TR   UPPER GI ENDOSCOPY      Allergies  No Known Allergies   Labs/Other Studies Reviewed    The following studies were reviewed today: None    Recent Labs: 08/03/2022: ALT 28; BUN 15; Creatinine, Ser 0.83; Hemoglobin 13.8; Platelets 174; Potassium 4.7; Sodium 141  Recent Lipid Panel    Component Value Date/Time   CHOL 128 08/03/2022 0818   CHOL 111 06/05/2013 0804   TRIG 151 (H) 08/03/2022 0818   TRIG 118 06/05/2013 0804   HDL 43 08/03/2022 0818   HDL 37 (L) 06/05/2013 0804   CHOLHDL 3.0 08/03/2022 0818   CHOLHDL 2.8 08/06/2015 0830   VLDL 26 08/06/2015 0830   LDLCALC 59 08/03/2022 0818   LDLCALC 50 06/05/2013 0804    History of Present Illness   62 year old male with the above past medical history including recurrent DVT on Xarelto, hypertension, hyperlipidemia, prostate cancer with  recurrence s/p XRT on antiandrogen therapy, and non-alcoholic fatty liver disease.   He had a prior DVT/PE following achilles tendon rupture.  He has a history of recurrent DVT/PE, thought to be related to possible risk factors for hypercoagulable state including COVID-19 infection, and prostate cancer.  Lower extremity duplex in November 2021 showed resolution of DVT.  However, given recurrence of DVT, he was advised that he would likely require lifelong anticoagulation.  He takes Crestor 10 mg every other day for hyperlipidemia.  He was last seen in the office on 07/09/2021 and was stable from a cardiac standpoint. He noted  occasional lightheadedness. He noted a persistent cough following a viral infection. He was seen in the ED in 04/2022 with abdominal pain. CBC was stable.  CT scan showed evidence of colitis. He was treated with Cipro and Flagyl and advised to follow-up with GI as an patient. He was advised to hold his Xarelto for 3 days.  He presents today for follow-up.  Since his last visit he has been stable from a cardiac standpoint.  He has experienced some weight gain in the setting of hormone replacement therapy for treatment of prostate cancer.  He has also had hot flashes, mildly improved with Effexor.  He has stable dependent nonpitting bilateral lower extremity edema he denies any chest pain, dyspnea, PND, orthopnea, or rapid weight gain.  Overall, he reports feeling well.  Home Medications    Current Outpatient Medications  Medication Sig Dispense Refill   albuterol (PROVENTIL HFA;VENTOLIN HFA) 108 (90 BASE) MCG/ACT inhaler Inhale 2 puffs into the lungs every 6 (six) hours as needed for wheezing or shortness of breath.      aspirin EC 81 MG tablet Take 81 mg by mouth daily. Swallow whole.     loratadine (CLARITIN) 10 MG tablet Take 10 mg by mouth daily.     montelukast (SINGULAIR) 10 MG tablet Take 10 mg by mouth every morning.   3   Multiple Vitamin (MULTIVITAMIN) capsule Take 1 capsule by mouth daily. Has stopped for procedure     rivaroxaban (XARELTO) 10 MG TABS tablet Take 1 tablet (10 mg total) by mouth daily. 90 tablet 3   venlafaxine (EFFEXOR) 75 MG tablet Take 75 mg by mouth 2 (two) times daily.     vitamin C (ASCORBIC ACID) 500 MG tablet Take 500 mg by mouth daily. Has stopped for procedure     bicalutamide (CASODEX) 50 MG tablet Take 50 mg by mouth daily. (Patient not taking: Reported on 08/24/2022)     lisinopril (ZESTRIL) 20 MG tablet Take 1 tablet (20 mg total) by mouth daily. 90 tablet 3   metroNIDAZOLE (FLAGYL) 500 MG tablet Take 1 tablet (500 mg total) by mouth 2 (two) times daily.  (Patient not taking: Reported on 08/24/2022) 14 tablet 0   rosuvastatin (CRESTOR) 5 MG tablet Take 1 tablet (5 mg total) by mouth daily. 90 tablet 3   No current facility-administered medications for this visit.     Review of Systems    He denies chest pain, palpitations, dyspnea, pnd, orthopnea, n, v, dizziness, syncope, edema, weight gain, or early satiety. All other systems reviewed and are otherwise negative except as noted above.   Physical Exam    VS:  BP 126/86 (BP Location: Left Arm, Patient Position: Sitting, Cuff Size: Large)   Pulse 84   Ht 5\' 9"  (1.753 m)   Wt 249 lb 9.6 oz (113.2 kg)   SpO2 96%   BMI 36.86 kg/m  GEN: Well nourished, well developed, in no acute distress. HEENT: normal. Neck: Supple, no JVD, carotid bruits, or masses. Cardiac: RRR, no murmurs, rubs, or gallops. No clubbing, cyanosis, edema.  Radials/DP/PT 2+ and equal bilaterally.  Respiratory:  Respirations regular and unlabored, clear to auscultation bilaterally. GI: Soft, nontender, nondistended, BS + x 4. MS: no deformity or atrophy. Skin: warm and dry, no rash. Neuro:  Strength and sensation are intact. Psych: Normal affect.  Accessory Clinical Findings    ECG personally reviewed by me today -NSR, 84 bpm- no acute changes.   Lab Results  Component Value Date   WBC 5.9 08/03/2022   HGB 13.8 08/03/2022   HCT 40.4 08/03/2022   MCV 96 08/03/2022   PLT 174 08/03/2022   Lab Results  Component Value Date   CREATININE 0.83 08/03/2022   BUN 15 08/03/2022   NA 141 08/03/2022   K 4.7 08/03/2022   CL 102 08/03/2022   CO2 26 08/03/2022   Lab Results  Component Value Date   ALT 28 08/03/2022   AST 42 (H) 08/03/2022   ALKPHOS 78 08/03/2022   BILITOT 0.3 08/03/2022   Lab Results  Component Value Date   CHOL 128 08/03/2022   HDL 43 08/03/2022   LDLCALC 59 08/03/2022   TRIG 151 (H) 08/03/2022   CHOLHDL 3.0 08/03/2022    No results found for: "HGBA1C"  Assessment & Plan    1.  Lightheadedness: Carotid dopplers were normal in 2020. Echo normal in 2009.  Possibly related to orthostatic hypotension.  Denies any recent dizziness, presyncope, syncope.  No indication for additional testing at this time.  Continue to monitor symptoms.  2.. H/o recurrent DVT: He has chronic nonpitting dependent bilateral lower extremity edema. Denies y recent bleeding on Xarelto.  Encouraged elevation, compression. Continue Xarelto.   3. Hypertension: BP well controlled. Continue current antihypertensive regimen.    4. Hyperlipidemia: LDL was 59 in 07/2022. Continue Crestor 5 mg daily.   5. Disposition: Follow-up in 1 year.      Joylene Grapes, NP 08/24/2022, 9:23 AM

## 2022-08-24 NOTE — Patient Instructions (Signed)
Medication Instructions:  No Changes In Medications at this time.   *If you need a refill on your cardiac medications before your next appointment, please call your pharmacy*  Follow-Up: At [facility], you and your health needs are our priority.  As part of our continuing mission to provide you with exceptional heart care, we have created designated Provider Care Teams.  These Care Teams include your primary Cardiologist (physician) and Advanced Practice Providers (APPs -  Physician Assistants and Nurse Practitioners) who all work together to provide you with the care you need, when you need it.  Your next appointment:   1 year(s)  Provider:   Chrystie Nose, MD

## 2023-03-28 ENCOUNTER — Other Ambulatory Visit: Payer: Self-pay | Admitting: Internal Medicine

## 2023-03-28 DIAGNOSIS — Z86718 Personal history of other venous thrombosis and embolism: Secondary | ICD-10-CM

## 2023-03-28 DIAGNOSIS — Z09 Encounter for follow-up examination after completed treatment for conditions other than malignant neoplasm: Secondary | ICD-10-CM

## 2023-03-29 NOTE — Telephone Encounter (Signed)
 Xarelto  10mg  refill request received. Pt is 62 years old, weight-113.2kg, Crea-0.83 on 08/03/22, last seen by Damien Braver on 08/24/22, Diagnosis-DVT, CrCl-149.65 mL/min; Dose is appropriate based on diagnosis dosing criteria. Will send in refill to requested pharmacy.

## 2023-04-01 ENCOUNTER — Encounter (HOSPITAL_BASED_OUTPATIENT_CLINIC_OR_DEPARTMENT_OTHER): Payer: Self-pay | Admitting: Internal Medicine

## 2023-08-23 ENCOUNTER — Ambulatory Visit: Payer: 59 | Attending: Internal Medicine | Admitting: Internal Medicine

## 2023-08-23 ENCOUNTER — Encounter: Payer: Self-pay | Admitting: Internal Medicine

## 2023-08-23 VITALS — BP 112/80 | HR 91 | Ht 70.0 in | Wt 250.6 lb

## 2023-08-23 DIAGNOSIS — I10 Essential (primary) hypertension: Secondary | ICD-10-CM | POA: Diagnosis not present

## 2023-08-23 DIAGNOSIS — E785 Hyperlipidemia, unspecified: Secondary | ICD-10-CM

## 2023-08-23 DIAGNOSIS — Z86718 Personal history of other venous thrombosis and embolism: Secondary | ICD-10-CM

## 2023-08-23 MED ORDER — RIVAROXABAN 10 MG PO TABS: 10.0000 mg | ORAL_TABLET | Freq: Every day | ORAL | 3 refills | Status: AC

## 2023-08-23 MED ORDER — ROSUVASTATIN CALCIUM 5 MG PO TABS: 5.0000 mg | ORAL_TABLET | Freq: Every day | ORAL | 3 refills | Status: AC

## 2023-08-23 MED ORDER — LISINOPRIL 20 MG PO TABS: 20.0000 mg | ORAL_TABLET | Freq: Every day | ORAL | 3 refills | Status: AC

## 2023-08-23 NOTE — Progress Notes (Signed)
 OFFICE NOTE  Chief Complaint:  Follow-up   Primary Care Physician: Sun, Vyvyan, MD  HPI:  Eric Olsen  is a 63 year old gentleman who is a patient of yours who has a history of hypertension, dyslipidemia and prostate cancer which he has been cured of. He also had a history of DVT and PE in the past and was thought to be I guess due to hypercoagulable state. Overall he is doing very well. At last visit we were working on weight loss and he has managed to do that, down to 203 today from 212 pounds. Blood pressure remains well controlled at 128/84. He is exercising more often than he had been previously.   We did obtain a lipid profile on him at his last visit including particle numbers which showed an LDL particle number of 1578 despite the fact that his calculated LDL was 88 and his triglycerides were 130, total cholesterol 155 with an HDL of 41. While this appears to be a fairly normal lipid profile based on cholesterol content, the high particle number indicates excess risk. I did recommend starting Crestor  20 mg daily. He reports he's had some mild muscle pains with this and has decreased the dose to 10 mg every other day fairly recently. However he did have a recent lipid profile which shows a marked improvement. LDL particle numbers now decreased to 688, and LDL-C. is 50.  This is associated with a marked decrease in his risk for cardiovascular events.  I saw Eric Olsen back in the office today. He is doing well and denies any chest pain or shortness of breath. He is reporting some insomnia. This lead to some daytime fatigue and occasionally naps at lunch time. Unfortunately he's gained back about 20 pounds and needs to continue to work on weight loss. He says he started to do some walking at lunch and in the evenings. He had recent blood work performed in March 2016 which showed total cholesterol 102, triglycerides 108, HDL 35 and LDL 45. This is after decreasing his Crestor  down to  10 mg daily.  08/12/2015  Eric Olsen returns today for follow-up. Generally seems to be doing well. He does get some fatigue but that seems to be related to radiation therapy for his prostate. He recently had a recheck of his lipid profile which shows excellent cholesterol control. Total cholesterol 94, triglycerides 128, HDL 34 and LDL 34.  08/13/2016  Eric Olsen was seen today in follow-up. He reports he's done well with cirrhosis prostate cancer. He says he's had no recurrence and is finished x-ray therapy. He is lipids are very well controlled. His recent total cluster was 101, turgor shows 137, HDL-C 30, LDL-C 44 (down from 52). He's currently on rosuvastatin  10 mg daily. He has a small amount awaking with decreased exercise reports a number of viral illnesses over the winter but is generally doing better. He's planning on starting back with more regular exercise. Denies any chest pain or worsening shortness of breath. Blood pressure is perfect today 122/84.  01/16/2018  Eric Olsen returns today in follow-up.  Over the year he is done well.  He denies any new chest pain or worsening shortness of breath.  He did start recently on Singulair as he has had some worsening asthma.  He did have childhood asthma but it apparently improved.  He has no cardiovascular disease history in fact had a heart cath in 2006 with no coronary disease.  There is family history  of early onset heart disease.  His last lipid profile 2 years ago showed excellent LDL of 34 on low-dose rosuvastatin  5 mg.  We argued whether or not he needs to be on statin medication, however he is tolerated it well and is comfortable taking it.    02/07/2019  Eric Olsen is seen today in follow-up.  Overall he is doing well without any chest pain or worsening shortness of breath.  Recent lipids show total cholesterol 122, triglycerides 143, HDL of 38 and LDL 59.  Blood pressure is reasonably well controlled at 127/90 today.  He noted that  he has had some visual changes over the last couple days.  He described some transient visual field abnormalities including waviness in his vision as well as some semicircular colorful flashes that were transient.  He says he has had this before and tends to get it in the fall associated potentially with sinus congestion.  He saw his ophthalmologist today felt that these might be ocular migraines.  Eric Olsen has no history of migraine or headache.  There apparently was no evidence of any atheroembolic disease on funduscopic exam.   02/08/2020  Eric Olsen is seen today in follow-up. He was seen by one of my partners in December last year with acute leg pain and swelling in the left leg. He had previously been exposed to COVID-19 over the holidays from family members. Although he did not test positive he did lose his sense of taste and smell. Subsequently he underwent ultrasound evaluation here which demonstrated infrapopliteal DVT presumably from a hypercoagulable state. Incidentally he was also recently noted to have increasing PSA suggesting a recurrence of prostate cancer. He had had remote prostatectomy and radiation and more recently has started on antiandrogen therapy. He tells me that he had a remote DVT/PE in the past which was thought to be due to an Achilles tendon surgery. He was placed on Eliquis  5 mg twice daily and advised that he may need to be on it for a year. At this point it has been almost a year since he has been on therapy. He reports no significant edema but some occasional tightness in his left calf. He did have recent lab work which showed mildly elevated potassium of 5.4, normal renal function with creatinine 0.96. Total cholesterol 129, HDL 38, LDL 66 and triglycerides 145.  08/23/2023  Eric Olsen is seen today in follow-up.  Overall he says he is feeling well.  He is now coming off of hormone therapy related to prostate cancer.  He says his energy is coming back somewhat.  He is  trying to walk more and lose some weight.  His blood pressure is well-controlled today.  He is overdue for lipid profile but his last test in May 2024 was well-controlled showing total cholesterol 128, HDL 43, triglycerides 151 and LDL 59.  He denies any bleeding issues on Xarelto .  He also takes low-dose aspirin and likely could stop that because of increased bleeding risks with no more efficacy on combination therapy.  PMHx:  Past Medical History:  Diagnosis Date   Asthma    states well controlled   Dyslipidemia    Fatty liver    GERD (gastroesophageal reflux disease)    History of DVT (deep vein thrombosis)    following achilles tendon surgery   History of gallstones    History of hiatal hernia    History of pulmonary embolus (PE)    following achilles tendon surgery , pt unaware  Hypertension    cardio Dr Maximo Spar-  LOV note 2/12 on chart   Lesion of bladder    Peripheral vascular disease (HCC)    hx DVT 10 years ago following achilles tendon tear & PE-  last anticoagulation years ago   PONV (postoperative nausea and vomiting)    after radial nerve surgery only   Prostate cancer (HCC)    Radial tunnel syndrome, right    Recurrent upper respiratory infection (URI)    recent head cold- was on augumentin- states no fever- states resolved    Past Surgical History:  Procedure Laterality Date   CARDIAC CATHETERIZATION  03/16/2005   RCA is large & dominant, no lesions, EF 60% (Dr. Laymon Priest)   CHOLECYSTECTOMY N/A 01/27/2016   Procedure: LAPAROSCOPIC CHOLECYSTECTOMY WITH INTRAOPERATIVE CHOLANGIOGRAM;  Surgeon: Adalberto Hollow, MD;  Location: [facility];  Service: General;  Laterality: N/A;   COLONOSCOPY     CYSTOSCOPY WITH BIOPSY N/A 09/12/2018   Procedure: CYSTOSCOPY WITH BLADDER / FULGURATION BIOPSY;  Surgeon: Adelbert Homans, MD;  Location: [facility];  Service: Urology;  Laterality: N/A;  ONLY NEEDS 30 MIN   NM MYOCAR PERF WALL MOTION  2006   persantine study  - inferior wall thinning; EF 63%; low risk   PROSTATE BIOPSY     Radial NERVE DECOMPRESSION     right elbow- radial    ROBOT ASSISTED LAPAROSCOPIC RADICAL PROSTATECTOMY  04/28/2011   Procedure: ROBOTIC ASSISTED LAPAROSCOPIC RADICAL PROSTATECTOMY;  Surgeon: Livingston Rigg, MD;  Location: [facility];  Service: Urology;  Laterality: N/A;   TONSILLECTOMY     TRANSTHORACIC ECHOCARDIOGRAM  2009   EF=>55%, borderline conc LVH, trace MR/TR   UPPER GI ENDOSCOPY      FAMHx:  Family History  Problem Relation Age of Onset   Heart attack Mother     SOCHx:   reports that he has never smoked. He has never used smokeless tobacco. He reports that he does not drink alcohol and does not use drugs.  ALLERGIES:  No Known Allergies  ROS: Pertinent items noted in HPI and remainder of comprehensive ROS otherwise negative.  HOME MEDS: Current Outpatient Medications  Medication Sig Dispense Refill   albuterol  (PROVENTIL  HFA;VENTOLIN  HFA) 108 (90 BASE) MCG/ACT inhaler Inhale 2 puffs into the lungs every 6 (six) hours as needed for wheezing or shortness of breath.      aspirin EC 81 MG tablet Take 81 mg by mouth daily. Swallow whole.     bicalutamide (CASODEX) 50 MG tablet Take 50 mg by mouth daily.     lisinopril  (ZESTRIL ) 20 MG tablet Take 1 tablet (20 mg total) by mouth daily. 90 tablet 3   loratadine (CLARITIN) 10 MG tablet Take 10 mg by mouth daily.     metroNIDAZOLE  (FLAGYL ) 500 MG tablet Take 1 tablet (500 mg total) by mouth 2 (two) times daily. 14 tablet 0   montelukast (SINGULAIR) 10 MG tablet Take 10 mg by mouth every morning.   3   Multiple Vitamin (MULTIVITAMIN) capsule Take 1 capsule by mouth daily. Has stopped for procedure     rivaroxaban  (XARELTO ) 10 MG TABS tablet TAKE 1 TABLET BY MOUTH EVERY DAY 90 tablet 1   rosuvastatin  (CRESTOR ) 5 MG tablet Take 1 tablet (5 mg total) by mouth daily. 90 tablet 3   vitamin C (ASCORBIC ACID) 500 MG tablet Take 500 mg by mouth daily. Has stopped for procedure      venlafaxine (EFFEXOR) 75 MG tablet Take 75 mg  by mouth 2 (two) times daily. (Patient not taking: Reported on 08/23/2023)     No current facility-administered medications for this visit.    LABS/IMAGING: No results found for this or any previous visit (from the past 48 hours). No results found.  VITALS: BP 112/80 (BP Location: Left Arm, Patient Position: Sitting)   Pulse 91   Ht 5\' 10"  (1.778 m)   Wt 250 lb 9.6 oz (113.7 kg)   SpO2 98%   BMI 35.96 kg/m   EXAM: General appearance: alert and no distress Neck: no carotid bruit and no JVD Lungs: clear to auscultation bilaterally Heart: regular rate and rhythm, S1, S2 normal, no murmur, click, rub or gallop Abdomen: soft, non-tender; bowel sounds normal; no masses,  no organomegaly Extremities: extremities normal, atraumatic, no cyanosis or edema Pulses: 2+ and symmetric Skin: Skin color, texture, turgor normal. No rashes or lesions Neurologic: GroPsych: Pleasanty normal Psych: Pleasant  EKG: EKG Interpretation Date/Time:  Tuesday Aug 23 2023 08:20:55 EDT Ventricular Rate:  91 PR Interval:  150 QRS Duration:  88 QT Interval:  348 QTC Calculation: 428 R Axis:   -23  Text Interpretation: Normal sinus rhythm Possible Anterior infarct , age undetermined When compared with ECG of 26-Jan-2016 11:52, No significant change since last tracing Confirmed by Dinah Franco [id number]) on 08/23/2023 8:30:39 AM    ASSESSMENT: Recent left infrapopliteal DVT, on Eliquis  (remote DVT/PE after Achilles tendon rupture) Hypertension Dyslipidemia, goal LDL <70 History prostate cancer with recurrence- s/p XRT, now off of antiandrogen therapy Scintillating scotomata  PLAN: 1.   Mr. Spitler seems to be doing well on chronic low-dose Xarelto .  He has not had any recurrent DVT that we are aware of.  His blood pressure is well-controlled.  His cholesterols been at goal but he is due for repeat lipids.  Will order a fasting lipid profile since he  recently saw his primary care provider and he said no labs were drawn.  So far it seems to be clear prostate cancer, no longer on antiandrogen therapy.  Follow-up with me annually or sooner as necessary.  Hazle Lites, MD, [facility], FACP  [place]  [facility]  Medical Director of the [facility] Lipid [facility] &  Cardiovascular Risk Reduction Clinic Diplomate of the American Board of Clinical Lipidology Attending Cardiologist  Direct Dial: [phone]  Fax: [phone]  Website:  [email or web address] 08/23/2023, 8:31 AM

## 2023-08-23 NOTE — Patient Instructions (Signed)
 Medication Instructions:  Your physician recommends that you continue on your current medications as directed. Please refer to the Current Medication list given to you today.  *If you need a refill on your cardiac medications before your next appointment, please call your pharmacy*  Lab Work: FASTING - Lipid Panel as soon as able If you have labs (blood work) drawn today and your tests are completely normal, you will receive your results only by: MyChart Message (if you have MyChart) OR A paper copy in the mail If you have any lab test that is abnormal or we need to change your treatment, we will call you to review the results.  Follow-Up: At [facility], you and your health needs are our priority.  As part of our continuing mission to provide you with exceptional heart care, our providers are all part of one team.  This team includes your primary Cardiologist (physician) and Advanced Practice Providers or APPs (Physician Assistants and Nurse Practitioners) who all work together to provide you with the care you need, when you need it.  Your next appointment:   1 year(s)  Provider:   Hazle Lites, MD
